# Patient Record
Sex: Female | Born: 1955
Health system: Southern US, Community
[De-identification: ages and names within clinical notes are randomized; demographics above are authoritative.]

## PROBLEM LIST (undated history)

## (undated) DIAGNOSIS — E871 Hypo-osmolality and hyponatremia: Secondary | ICD-10-CM

## (undated) DIAGNOSIS — R569 Unspecified convulsions: Secondary | ICD-10-CM

## (undated) HISTORY — DX: Hypo-osmolality and hyponatremia: E87.1

## (undated) HISTORY — DX: Unspecified convulsions: R56.9

---

## 2000-08-27 ENCOUNTER — Other Ambulatory Visit: Admission: RE | Admit: 2000-08-27 | Discharge: 2000-08-27 | Payer: Self-pay | Admitting: Obstetrics and Gynecology

## 2002-03-23 ENCOUNTER — Other Ambulatory Visit: Admission: RE | Admit: 2002-03-23 | Discharge: 2002-03-23 | Payer: Self-pay | Admitting: Obstetrics and Gynecology

## 2004-01-18 ENCOUNTER — Encounter: Admission: RE | Admit: 2004-01-18 | Discharge: 2004-01-18 | Payer: Self-pay | Admitting: Obstetrics and Gynecology

## 2004-03-08 ENCOUNTER — Encounter (INDEPENDENT_AMBULATORY_CARE_PROVIDER_SITE_OTHER): Payer: Self-pay | Admitting: *Deleted

## 2004-03-08 ENCOUNTER — Ambulatory Visit (HOSPITAL_COMMUNITY): Admission: RE | Admit: 2004-03-08 | Discharge: 2004-03-08 | Payer: Self-pay | Admitting: Obstetrics and Gynecology

## 2004-03-08 ENCOUNTER — Ambulatory Visit (HOSPITAL_BASED_OUTPATIENT_CLINIC_OR_DEPARTMENT_OTHER): Admission: RE | Admit: 2004-03-08 | Discharge: 2004-03-08 | Payer: Self-pay | Admitting: Obstetrics and Gynecology

## 2007-06-24 ENCOUNTER — Observation Stay (HOSPITAL_COMMUNITY): Admission: AD | Admit: 2007-06-24 | Discharge: 2007-06-25 | Payer: Self-pay | Admitting: Cardiology

## 2010-06-27 NOTE — H&P (Signed)
[name], [name]                   ACCOUNT NO.:  [id number]   MEDICAL RECORD NO.:  [id number]          PATIENT TYPE:  INP   LOCATION:  3739                         FACILITY:  [facility]   PHYSICIAN:  Tamera C. Lewis, N.P.  DATE OF BIRTH:  Mar 21, 1955   DATE OF ADMISSION:  06/24/2007  DATE OF DISCHARGE:                              HISTORY & PHYSICAL   HISTORY OF PRESENT ILLNESS:  Victoria Stokes is a pleasant 55 year old female  recently referred to [place] by her primary care Victoria Stokes.  She woke up this  morning with a  chest tightness in the left precordial area.  States  it felt like a poundy discomfort, but she denying any tachy palpitations  or any sensation of skipped beats.  She states she does continue to have  the tightness, which radiates to the left shoulder.  It is 3-4 on scale  of 1-10, where it was a 6 earlier this morning.  Nothing seems to make  the discomfort better or worse.  She has had some shortness of breath,  but no sweats or nausea.  The EKG in the office revealed sinus  bradycardia with no EKG changes, but due to the continued discomfort,  she is being admitted to rule out acute coronary syndrome.   REVIEW OF SYSTEMS:  GENERAL:  Has felt bad in general over the last few  days.  Denies any fever or chills.  HEENT:  No problems with vision and  hearing.  No nasal drainage, sore throat, or problems swallowing.  CHEST:  As above.  Denies any pleuritic nature to the discomfort. LUNGS:  Shortness of breath as above.  Denies PND or orthopnea.  No cough or  wheezing.  ABDOMEN:  No nausea, abdominal pain, vomiting, hematochezia,  or melena.  GU:  Denies any problems with dysuria, urinary frequency,  hematuria, or nocturia.  EXTREMITIES:  Reveals no edema.  No leg  claudication symptoms.  Arm pain from earlier has resolved.   PAST MEDICAL HISTORY:  Negative.   PAST SURGICAL HISTORY:  C-section, 10.  Injuries, head injury from a  fall, 1985, was on antiseizure medication for short  time.   CURRENT MEDICATIONS:  Aspirin 81 mg daily p.r.n.   DRUG ALLERGIES:  None known.   SOCIAL HISTORY:  She is married.  She is a Environmental education officer.  Does  not smoke.  Alcohol on rare social settings.  No excessive caffeine use.  No illicit drug use.  She exercises regularly and never has discomfort  with exercise.   FAMILY HISTORY:  Her father died at age 58 from pancreatic cancer.  Mother died at age 6 from breast cancer.  She has 4 siblings, 3 sisters  and a brother, all in fairly good condition.  There is no early coronary  artery disease history.   OBJECTIVE DATA:  Her weight is 152, pulse is 48, and blood pressure is  130/80.  GENERAL:  Very pleasant, well-nourished female in no acute distress.  SKIN:  Warm and dry.  HEENT:  Ear, nose and throat are unremarkable.  NECK:  No  JVD.  No carotid bruits are auscultated.  No adenopathy.  CHEST:  Good excursion.  Clear throughout.  HEART:  Normal S1, S2 without murmur, S3, S4.  Bradycardic at 52 beats  per minute.  ABDOMEN:  Soft and nontender.  No abdominal bruits.  Bowel sounds.  No  tenderness.  No masses or hepatosplenomegaly.  EXTREMITIES:  MAE x4.  No clubbing, cyanosis or edema.  +2 distal  pulses.   EKG reveals sinus bradycardia with no ST segment abnormalities.   IMPRESSION:  1. Chest pain, atypical.  2. Dyspnea.   PLAN:  Admit.  Obtain cardiac isoenzymes.  Nitroglycerin if needed for  chest discomfort.  Make n.p.o. after midnight for now.      Tamera C. Melvyn Neth, N.P.     TCL/MEDQ  D:  06/24/2007  T:  06/25/2007  Job:  [id number]

## 2010-06-27 NOTE — Discharge Summary (Signed)
NAMETHANA, RAMP                   ACCOUNT NO.:  [id number]   MEDICAL RECORD NO.:  [id number]          PATIENT TYPE:  INP   LOCATION:  3739                         FACILITY:  [facility]   PHYSICIAN:  Armanda Magic, M.D.     DATE OF BIRTH:  [birth year]   DATE OF ADMISSION:  06/24/2007  DATE OF DISCHARGE:  06/25/2007                               DISCHARGE SUMMARY   DISCHARGE DIAGNOSES:  1. Atypical chest pain, resolved.  2. Bradycardia in the 50s, asymptomatic.   HOSPITAL COURSE:  Victoria Stokes is a 55 year old female who awoke on the day  of admission with chest tightness in the left chest area region.  She  said that it was like a pounding discomfort, but denies any tachy  palpitations or any sensation of skipped beats.  She said at one point  the discomfort radiated to the left shoulder.  She has had some  shortness of breath but denies any sweating, nausea, or vomiting.  Nothing made the discomfort better or worse.  She continued to have the  tightness in the clinic, therefore, she was admitted to rule out  myocardial infarction.   She was ruled out by cardiac isoenzymes.  We performed a stress  Cardiolite.  She walked almost 11 minutes without any discomfort.  Her  images showed no evidence of ischemia or infarction, and she was  discharged to home.   LABORATORY DATA:  Lab studies showed, hemoglobin of 13.2, hematocrit  39.1, platelets 246, and white count 5.6.  CK-MB and troponin negative  x2.  Total cholesterol 182, LDL 102, HDL 67, triglycerides 64, TSH  2.414, and D-dimer 0.22.  Sodium 139, potassium 3.6, glucose 87, BUN 7,  and creatinine 0.89.  LFTs were normal.  Chest x-rays showed no acute  disease.   The patient does complain of some sleep difficulty and states that she  has not been sleeping well recently.  She and I have discussed this and  I have agreed to give her Ambien 10 mg 1 p.o. q.h.s. p.r.n. #15, no  refills.   She will wear event monitor, and this will be mailed  to her house.  We  wanted to make sure there is no underlying electrical abnormality.  Otherwise, she is to increase her activity slowly.  Remain on the low-  sodium, heart-healthy diet.  Follow up with Dr. Mayford Knife on July 24, 2007  at 11:15 a.m.      Guy Franco, P.A.      Armanda Magic, M.D.  Electronically Signed    LB/MEDQ  D:  06/25/2007  T:  06/26/2007  Job:  [id number]

## 2010-06-30 NOTE — Op Note (Signed)
Victoria Stokes, Victoria Stokes                   ACCOUNT NO.:  [id number]   MEDICAL RECORD NO.:  [id number]          PATIENT TYPE:  AMB   LOCATION:  [facility]                         FACILITY:  [facility]   PHYSICIAN:  Sherry A. Dickstein, M.D.DATE OF BIRTH:  May 10, 1955   DATE OF PROCEDURE:  03/08/2004  DATE OF DISCHARGE:                                 OPERATIVE REPORT   PREOPERATIVE DIAGNOSIS:  Menorrhagia, endometrial polyps.   POSTOPERATIVE DIAGNOSIS:  Menorrhagia, endometrial polyps, and submucosal  fibroids.   PROCEDURE:  D&C hysteroscopy with resectoscope.   SURGEON:  Sherry A. Rosalio Macadamia, M.D.   ANESTHESIA:  MAC.   INDICATIONS:  This is a 55 year old G2, P2-0-0-2 woman with menstrual  periods every month lasting approximately four to five days. The patient has  excessive heavy flow with her menstrual periods. Ultrasound was performed  revealing some small fibroids and a thickened endometrium. The findings were  reviewed with the patient. The patient was offered a sonohysterogram to  definitely diagnose a polyp or fibroid. The patient requested D&C directly  rather than performing the sonohysterogram first. It was felt that it was a  probable  endometrial polyp present.   FINDINGS:  Slightly enlarged anteflexed uterus approximately 10 weeks size.  No adnexal mass. Polypoid tissue in the top of the cavity and submucosal  fibroid present.   PROCEDURE:  The patient was brought to the operating room and given adequate  IV sedation. She was placed in dorsal lithotomy position. Perineum was  washed with Hibiclens. The patient was draped in a sterile fashion. Pelvic  examination was performed. Speculum was placed within the vagina. Vagina was  washed with Hibiclens. Paracervical block was administered with 1%  ____nesacaine______________. Anterior lip of the cervix was grasped with a  single-toothed tenaculum. Cervix was sounded. Cervix was dilated with  Pratt's dilators to a #1. Hysteroscope was  easily introduced into the  endometrial cavity and pictures were obtained. The polypoid tissue was  removed initially with the superficial sheets of endometrial using a double-  looped right angle resector. A posterior wall submucosal fibroid was felt to  be present during the procedure. This was removed separately. Bleeders were  cauterized. Pictures were obtained before and after the procedure. Adequate  hemostasis was present. All instruments removed from the vagina. The patient  was taken out of the dorsal lithotomy position. She was awakened. She was  moved from the operating table to a stretcher in stable condition.  Complications were none. Estimated blood loss less than 5 cc. Sorbitol  differential -80 cc.    SAD/MEDQ  D:  03/08/2004  T:  03/08/2004  Job:  [id number]

## 2011-02-20 ENCOUNTER — Other Ambulatory Visit (HOSPITAL_COMMUNITY): Payer: Self-pay | Admitting: Gastroenterology

## 2011-02-20 DIAGNOSIS — R1012 Left upper quadrant pain: Secondary | ICD-10-CM

## 2011-02-20 DIAGNOSIS — R1032 Left lower quadrant pain: Secondary | ICD-10-CM

## 2011-02-27 ENCOUNTER — Other Ambulatory Visit (HOSPITAL_COMMUNITY): Payer: Self-pay

## 2013-06-18 ENCOUNTER — Other Ambulatory Visit: Payer: Self-pay | Admitting: Family Medicine

## 2013-06-18 DIAGNOSIS — M25561 Pain in right knee: Secondary | ICD-10-CM

## 2013-07-07 ENCOUNTER — Ambulatory Visit
Admission: RE | Admit: 2013-07-07 | Discharge: 2013-07-07 | Disposition: A | Discharge status: Home / Self Care | Payer: BC Managed Care – PPO | Source: Ambulatory Visit | Attending: Family Medicine | Admitting: Family Medicine

## 2013-07-07 DIAGNOSIS — M25561 Pain in right knee: Secondary | ICD-10-CM

## 2014-02-24 ENCOUNTER — Ambulatory Visit (INDEPENDENT_AMBULATORY_CARE_PROVIDER_SITE_OTHER): Payer: BLUE CROSS/BLUE SHIELD | Admitting: Sports Medicine

## 2014-02-24 ENCOUNTER — Encounter: Payer: Self-pay | Admitting: Sports Medicine

## 2014-02-24 VITALS — BP 122/76 | HR 56 | Ht 67.0 in | Wt 145.0 lb

## 2014-02-24 DIAGNOSIS — M4120 Other idiopathic scoliosis, site unspecified: Secondary | ICD-10-CM

## 2014-02-24 DIAGNOSIS — M25562 Pain in left knee: Secondary | ICD-10-CM

## 2014-02-24 NOTE — Assessment & Plan Note (Signed)
Due to small MM tear with surrounding fluid, also seen in ST tendon with fluid. Due to underlying biomechanical factors. -Bike fit -Set-ups, crossover, and lateral step-ups 10 x 3 sets each -Body helix compression sleeve fitted and applied to right knee -Aleve 1-2 tablets around dinner time to see if helps nighttime symptoms. -Other things like Tumeric or Arnica gel may be beneficial -Follow-up in about 8 weeks or sooner if needed

## 2014-02-24 NOTE — Progress Notes (Signed)
   Subjective:    Patient ID: Victoria Stokes, female    DOB: [birth year], 59 y.o.   MRN: [id number]  HPI  Mrs. Muhlbauer is a 59 year old new patient who presents with right knee pain. Onset of symptoms was approximately one year ago without any known acute injury. She was previously seen at [facility] where x-rays and an MRI were performed of the right knee. This is available for review today. She attributes that perhaps her increase in cycling and hiking lead to her symptoms. She is also former [employer] fast streamer, right arm dominant, and has dealt with dextroscoliosis much of her younger years. Symptoms are aggravated with activities such as standing from a seated position. She has tried some strengthening of her abductors and sparing use of anti-inflammatories, but she does not like taking much medication. She has not tried any compression. She is looking forward to a 5 day cycling trip as well as a hiking trip in April of this year. She denies significant mechanical locking, popping, or swelling.  Past medical history, social history, medications, and allergies were reviewed and are up to date in the chart.  Review of Systems 7 point review of systems was performed and was otherwise negative unless noted in the history of present illness.     Objective:   Physical Exam BP 122/76 mmHg  Pulse 56  Ht 5\' 7"  (1.702 m)  Wt 145 lb (65.772 kg)  BMI 22.71 kg/m2 GEN: The patient is well-developed well-nourished female and in no acute distress.  She is awake alert and oriented x3. SKIN: warm and well-perfused, no rash  EXTR: No lower extremity edema or calf tenderness Neuro: Strength 5/5 globally. Sensation intact throughout. No focal deficits. Vasc: +2 bilateral distal pulses. No edema.  MSK: Standing structural exam reveals elevation of the right iliac crest. Gait analysis reveals that she drops the right shoulder, right hip, in places additional pressure on the lateral portion  of the right knee. No leg length discrepancy is noted. She has good hip abductor strength. Good VMO strength. Her medial abductors are slightly weak. Examination of the right knee reveals no effusion. Range of motion is from 0-120 with mild pain medially. Negative patellar grind test. No laxity with valgus or varus stress testing. No overlying erythema or induration.  MRI 07/07/13: IMPRESSION: 1. Complex tear of the posterior horn- body junction of the medial meniscus extending to the inferior and superior articular surfaces.  Limited musculoskeletal ultrasound: Long and short axis views were obtained of the right knee which show a minimal increase in hypoechoic fluid in the suprapatellar pouch. A small tear is seen in the mid medial meniscus without significant fluid edema. Going posteriorly, she has an increase in hypoechoic fluid, though she has more fluid seen around the medial semi-tendinosis tendon posteriorly. The remainder of the knee exam was unremarkable.    Assessment & Plan:  Please see problem based assessment and plan in the problem list.

## 2014-02-24 NOTE — Assessment & Plan Note (Signed)
Causing right rotation of thoracolumbar spine and right hip. Drops right shoulder with gait. Increased R knee pressure. -Added right lateral heel wedge to try to unload right knee -Stretching, rotation exercise to try to correct curve

## 2014-02-24 NOTE — Patient Instructions (Signed)
-  Bike fit, no more than 80 deg knee bend, about an inch short of full knee extension -Set-ups, crossover step-ups, and lateral step-ups 10 x 3 sets each at least 3-4x/wk. Limit if more than mild pain with this. -Body helix compression sleeve fitted and applied to right knee, wear when active but not if resting or sleeping -Aleve 1-2 tablets around dinner time to see if helps nighttime symptoms. -Other things like Tumeric or Arnica gel may be beneficial -Gentle rotational exercises and stretching to bend to right may help correct the curve and prevent muscle tightness. -Follow-up in about 8 weeks or sooner if needed

## 2014-04-01 ENCOUNTER — Ambulatory Visit: Payer: BLUE CROSS/BLUE SHIELD | Admitting: Sports Medicine

## 2014-04-14 ENCOUNTER — Ambulatory Visit (INDEPENDENT_AMBULATORY_CARE_PROVIDER_SITE_OTHER): Payer: BLUE CROSS/BLUE SHIELD | Admitting: Sports Medicine

## 2014-04-14 ENCOUNTER — Encounter: Payer: Self-pay | Admitting: Sports Medicine

## 2014-04-14 VITALS — BP 91/63 | Ht 67.0 in | Wt 145.0 lb

## 2014-04-14 DIAGNOSIS — M25561 Pain in right knee: Secondary | ICD-10-CM

## 2014-04-14 NOTE — Assessment & Plan Note (Signed)
Ultrasound resolution of previously seen small right knee effusion and meniscal changes -Advance activities to include straight leg raises and 20 maximum knee bend exercises -Once her knee pain resolves, she can start to try the upright bicycle -Continue the body was compression sleeve -Anti-inflammatories if needed -She will followup after her bicycle trip or sooner if needed

## 2014-04-14 NOTE — Progress Notes (Signed)
   Subjective:    Patient ID: Victoria Stokes, female    DOB: 1955-08-22, 59 y.o.   MRN: [id number]  HPI Victoria Stokes Is a 59 year old female who presents for followup of right knee pain.  Her symptoms have improved since we last saw her.  She is been wearing the body helix compression sleeve to the right knee.  She denies any swelling or mechanical locking.  Location of pain is across the medial joint line.  Recall, that there was a possible complex tear involving the posterior horn of the medial meniscus seen on MRI.  She has been doing the recumbent bike and some gentle exercises.  She is trying to prepare for a five-day bike trip to [place] in the middle of April.  She denies any feeling of knee instability.  Past medical history, social history, medications, and allergies were reviewed and are up to date in the chart.  Review of Systems 7 point review of systems was performed and was otherwise negative unless noted in the history of present illness.     Objective:   Physical Exam BP 91/63 mmHg  Ht 5\' 7"  (1.702 m)  Wt 145 lb (65.772 kg)  BMI 22.71 kg/m2 GEN: The patient is well-developed well-nourished female and in no acute distress.  She is awake alert and oriented x3. SKIN: warm and well-perfused, no rash  EXTR: No lower extremity edema or calf tenderness Neuro: Strength 5/5 globally. Sensation intact throughout. No focal deficits. Vasc: +2 bilateral distal pulses. No edema.  MSK: Examination of the right knee reveals no effusion.  No erythema or induration.  No medial joint line tenderness.  Negative patellar grind test.  No instability with valgus or varus testing. No atrophy.  Limited musculoskeletal ultrasound: he is of the right knee were obtained.  Previously demonstrated small knee effusion has resolved.  The medial meniscus appears to have normalized and there is no longer surrounding fluid edema.  The previously seen semi-tendinosis fluid has completely resolved.      Assessment & Plan:  Please see problem based assessment and plan in the problem list.

## 2014-06-10 ENCOUNTER — Ambulatory Visit: Payer: BLUE CROSS/BLUE SHIELD | Admitting: Sports Medicine

## 2015-06-28 DIAGNOSIS — D2371 Other benign neoplasm of skin of right lower limb, including hip: Secondary | ICD-10-CM | POA: Diagnosis not present

## 2015-07-26 DIAGNOSIS — Z0000 Encounter for general adult medical examination without abnormal findings: Secondary | ICD-10-CM | POA: Diagnosis not present

## 2015-09-28 DIAGNOSIS — H00021 Hordeolum internum right upper eyelid: Secondary | ICD-10-CM | POA: Diagnosis not present

## 2015-10-26 DIAGNOSIS — H3530 Unspecified macular degeneration: Secondary | ICD-10-CM | POA: Diagnosis not present

## 2015-11-11 DIAGNOSIS — Z23 Encounter for immunization: Secondary | ICD-10-CM | POA: Diagnosis not present

## 2015-11-11 DIAGNOSIS — G479 Sleep disorder, unspecified: Secondary | ICD-10-CM | POA: Diagnosis not present

## 2015-11-15 DIAGNOSIS — D2371 Other benign neoplasm of skin of right lower limb, including hip: Secondary | ICD-10-CM | POA: Diagnosis not present

## 2015-11-15 DIAGNOSIS — D2372 Other benign neoplasm of skin of left lower limb, including hip: Secondary | ICD-10-CM | POA: Diagnosis not present

## 2015-11-17 DIAGNOSIS — L821 Other seborrheic keratosis: Secondary | ICD-10-CM | POA: Diagnosis not present

## 2016-02-10 DIAGNOSIS — Z803 Family history of malignant neoplasm of breast: Secondary | ICD-10-CM | POA: Diagnosis not present

## 2016-02-10 DIAGNOSIS — Z1231 Encounter for screening mammogram for malignant neoplasm of breast: Secondary | ICD-10-CM | POA: Diagnosis not present

## 2016-02-21 DIAGNOSIS — D2371 Other benign neoplasm of skin of right lower limb, including hip: Secondary | ICD-10-CM | POA: Diagnosis not present

## 2016-02-21 DIAGNOSIS — M21961 Unspecified acquired deformity of right lower leg: Secondary | ICD-10-CM | POA: Diagnosis not present

## 2016-04-02 DIAGNOSIS — Z6821 Body mass index (BMI) 21.0-21.9, adult: Secondary | ICD-10-CM | POA: Diagnosis not present

## 2016-04-02 DIAGNOSIS — Z01419 Encounter for gynecological examination (general) (routine) without abnormal findings: Secondary | ICD-10-CM | POA: Diagnosis not present

## 2016-07-17 DIAGNOSIS — M859 Disorder of bone density and structure, unspecified: Secondary | ICD-10-CM | POA: Diagnosis not present

## 2016-07-17 DIAGNOSIS — G479 Sleep disorder, unspecified: Secondary | ICD-10-CM | POA: Diagnosis not present

## 2016-07-17 DIAGNOSIS — Z13220 Encounter for screening for lipoid disorders: Secondary | ICD-10-CM | POA: Diagnosis not present

## 2016-07-17 DIAGNOSIS — Z0000 Encounter for general adult medical examination without abnormal findings: Secondary | ICD-10-CM | POA: Diagnosis not present

## 2016-07-30 DIAGNOSIS — M546 Pain in thoracic spine: Secondary | ICD-10-CM | POA: Diagnosis not present

## 2016-07-30 DIAGNOSIS — R112 Nausea with vomiting, unspecified: Secondary | ICD-10-CM | POA: Diagnosis not present

## 2016-08-01 DIAGNOSIS — E871 Hypo-osmolality and hyponatremia: Secondary | ICD-10-CM | POA: Diagnosis not present

## 2016-08-02 ENCOUNTER — Other Ambulatory Visit: Payer: Self-pay | Admitting: Family Medicine

## 2016-08-02 ENCOUNTER — Ambulatory Visit
Admission: RE | Admit: 2016-08-02 | Discharge: 2016-08-02 | Disposition: A | Discharge status: Home / Self Care | Payer: BLUE CROSS/BLUE SHIELD | Source: Ambulatory Visit | Attending: Family Medicine | Admitting: Family Medicine

## 2016-08-02 DIAGNOSIS — E871 Hypo-osmolality and hyponatremia: Secondary | ICD-10-CM

## 2016-08-02 DIAGNOSIS — G40909 Epilepsy, unspecified, not intractable, without status epilepticus: Secondary | ICD-10-CM | POA: Diagnosis not present

## 2016-08-03 DIAGNOSIS — S22040A Wedge compression fracture of fourth thoracic vertebra, initial encounter for closed fracture: Secondary | ICD-10-CM | POA: Diagnosis not present

## 2016-08-06 DIAGNOSIS — R569 Unspecified convulsions: Secondary | ICD-10-CM | POA: Diagnosis not present

## 2016-08-06 DIAGNOSIS — M4854XD Collapsed vertebra, not elsewhere classified, thoracic region, subsequent encounter for fracture with routine healing: Secondary | ICD-10-CM | POA: Diagnosis not present

## 2016-08-09 DIAGNOSIS — D72819 Decreased white blood cell count, unspecified: Secondary | ICD-10-CM | POA: Diagnosis not present

## 2016-08-09 DIAGNOSIS — R899 Unspecified abnormal finding in specimens from other organs, systems and tissues: Secondary | ICD-10-CM | POA: Diagnosis not present

## 2016-08-27 DIAGNOSIS — S22040D Wedge compression fracture of fourth thoracic vertebra, subsequent encounter for fracture with routine healing: Secondary | ICD-10-CM | POA: Diagnosis not present

## 2016-09-03 DIAGNOSIS — M810 Age-related osteoporosis without current pathological fracture: Secondary | ICD-10-CM | POA: Diagnosis not present

## 2016-09-03 DIAGNOSIS — M8588 Other specified disorders of bone density and structure, other site: Secondary | ICD-10-CM | POA: Diagnosis not present

## 2016-09-14 DIAGNOSIS — E871 Hypo-osmolality and hyponatremia: Secondary | ICD-10-CM | POA: Diagnosis not present

## 2016-09-20 DIAGNOSIS — M810 Age-related osteoporosis without current pathological fracture: Secondary | ICD-10-CM | POA: Diagnosis not present

## 2016-09-20 DIAGNOSIS — M4854XD Collapsed vertebra, not elsewhere classified, thoracic region, subsequent encounter for fracture with routine healing: Secondary | ICD-10-CM | POA: Diagnosis not present

## 2016-09-20 DIAGNOSIS — E871 Hypo-osmolality and hyponatremia: Secondary | ICD-10-CM | POA: Diagnosis not present

## 2016-10-29 ENCOUNTER — Encounter: Payer: Self-pay | Admitting: Neurology

## 2016-10-29 ENCOUNTER — Ambulatory Visit (INDEPENDENT_AMBULATORY_CARE_PROVIDER_SITE_OTHER): Payer: BLUE CROSS/BLUE SHIELD | Admitting: Neurology

## 2016-10-29 ENCOUNTER — Encounter (INDEPENDENT_AMBULATORY_CARE_PROVIDER_SITE_OTHER): Payer: Self-pay

## 2016-10-29 DIAGNOSIS — R569 Unspecified convulsions: Secondary | ICD-10-CM | POA: Insufficient documentation

## 2016-10-29 HISTORY — DX: Unspecified convulsions: R56.9

## 2016-10-29 MED ORDER — ALPRAZOLAM 0.5 MG PO TABS: ORAL_TABLET | ORAL | 0 refills | Status: DC

## 2016-10-29 NOTE — Patient Instructions (Signed)
   We will check MRI of the brain and get an EEG study.

## 2016-10-29 NOTE — Progress Notes (Signed)
Reason for visit: Seizures  Referring physician: Dr. Stark Jock is a 61 y.o. female  History of present illness:  Victoria Stokes is a 61 year old right-handed white female with a history of seizures. The patient indicates that her first seizure occurred about an hour following a concussion when she fell on ice while ice skating. The patient had a generalized tonic clonic seizure at that time that was witnessed, the seizure occurred at the hospital. This was in 1985. The patient had a second seizure in 1987 when she had just finished running a triathlon. This was felt secondary to electrolyte disturbances. The patient had a third seizure in June 2018. The patient had been working in the garden on a hot day, the seizure event was unwitnessed and occurred at nighttime. The patient noted onset of back pain associated with a T4 compression fracture and she had bitten her tongue, she had nausea and vomiting after the seizure event. The patient was noted to have a sodium level of 126. The patient underwent a CT scan of the brain that was unremarkable. She is sent to this office for an evaluation. She has not had any further seizure-type events. She reports no numbness or weakness of the face, arms, or legs. She denies issues controlling the bowels or the bladder or any difficulty with balance. She has been drinking salted water to get her sodium level up, she claims that her recent blood evaluation revealed that the sodium level had returned to normal. She has in the past chronically run low sodium levels in the low 130 range. The patient denies a family history of seizures. She is sent to this office for an evaluation.  Past Medical History:  Diagnosis Date  . Low sodium levels    Hx    Past Surgical History:  Procedure Laterality Date  . CESAREAN SECTION      Family History  Problem Relation Age of Onset  . Breast cancer Mother   . Pancreatic cancer Father     Social history:  reports  that she has never smoked. She has never used smokeless tobacco. She reports that she drinks alcohol. She reports that she does not use drugs.  Medications:  Prior to Admission medications   Medication Sig Start Date End Date Taking? Authorizing Provider  B Complex Vitamins (VITAMIN B-COMPLEX PO) Take by mouth.   Yes [provider]  CALCIUM PO Take by mouth.   Yes [provider]  Cholecalciferol (VITAMIN D PO) Take by mouth.   Yes [provider]  CHOLINE PO Take by mouth.   Yes [provider]  VITAMIN A PO Take by mouth.   Yes [provider]  VITAMIN K PO Take by mouth.   Yes [provider]  zolpidem (AMBIEN) 5 MG tablet Take 5 mg by mouth as needed for sleep.   Yes [provider]     No Known Allergies  ROS:  Out of a complete 14 system review of symptoms, the patient complains only of the following symptoms, and all other reviewed systems are negative.  Seizures  Blood pressure (!) 91/59, pulse (!) 59, height 5\' 7"  (1.702 m), weight 148 lb (67.1 kg).  Physical Exam  General: The patient is alert and cooperative at the time of the examination.  Eyes: Pupils are equal, round, and reactive to light. Discs are flat bilaterally.  Neck: The neck is supple, no carotid bruits are noted.  Respiratory: The respiratory examination  is clear.  Cardiovascular: The cardiovascular examination reveals a regular rate and rhythm, no obvious murmurs or rubs are noted.  Skin: Extremities are without significant edema.  Neurologic Exam  Mental status: The patient is alert and oriented x 3 at the time of the examination. The patient has apparent normal recent and remote memory, with an apparently normal attention span and concentration ability.  Cranial nerves: Facial symmetry is present. There is good sensation of the face to pinprick and soft touch bilaterally. The strength of the facial muscles and the muscles to head  turning and shoulder shrug are normal bilaterally. Speech is well enunciated, no aphasia or dysarthria is noted. Extraocular movements are full. Visual fields are full. The tongue is midline, and the patient has symmetric elevation of the soft palate. No obvious hearing deficits are noted.  Motor: The motor testing reveals 5 over 5 strength of all 4 extremities. Good symmetric motor tone is noted throughout.  Sensory: Sensory testing is intact to pinprick, soft touch, vibration sensation, and position sense on all 4 extremities. No evidence of extinction is noted.  Coordination: Cerebellar testing reveals good finger-nose-finger and heel-to-shin bilaterally.  Gait and station: Gait is normal. Tandem gait is normal. Romberg is negative. No drift is seen.  Reflexes: Deep tendon reflexes are symmetric and normal bilaterally. Toes are downgoing bilaterally.   CT head 08/02/16:  IMPRESSION: No acute intracranial abnormalities.  * CT scan images were reviewed online. I agree with the written report.    Assessment/Plan:  1. History of seizures  2. Chronic hyponatremia  The patient has had 3 seizures lifetime, the first associated with a concussion, and 2 other seizures associated with electrolyte disturbances. The seizures may be symptomatic seizures, but given the history of a concussion, the patient may be at risk for epilepsy. She will undergo MRI of the brain with and without gadolinium enhancement, she will have an EEG study. She will follow-up in 6 months, I have asked her not to operate a motor vehicle for 6 months following the date of the last seizure. We will not initiate anticonvulsant therapy at this time.   Jill Alexanders MD 10/29/2016 8:25 AM  [facility] [address] [place], [place] [address]  Phone [phone] Fax [phone]

## 2016-11-05 ENCOUNTER — Telehealth: Payer: Self-pay | Admitting: Neurology

## 2016-11-05 ENCOUNTER — Ambulatory Visit (INDEPENDENT_AMBULATORY_CARE_PROVIDER_SITE_OTHER): Payer: BLUE CROSS/BLUE SHIELD | Admitting: Neurology

## 2016-11-05 DIAGNOSIS — G40909 Epilepsy, unspecified, not intractable, without status epilepticus: Secondary | ICD-10-CM

## 2016-11-05 DIAGNOSIS — E871 Hypo-osmolality and hyponatremia: Secondary | ICD-10-CM | POA: Diagnosis not present

## 2016-11-05 DIAGNOSIS — R569 Unspecified convulsions: Secondary | ICD-10-CM

## 2016-11-05 DIAGNOSIS — R5383 Other fatigue: Secondary | ICD-10-CM | POA: Diagnosis not present

## 2016-11-05 DIAGNOSIS — Z8349 Family history of other endocrine, nutritional and metabolic diseases: Secondary | ICD-10-CM | POA: Diagnosis not present

## 2016-11-05 DIAGNOSIS — Z87898 Personal history of other specified conditions: Secondary | ICD-10-CM | POA: Diagnosis not present

## 2016-11-05 MED ORDER — LEVETIRACETAM 500 MG PO TABS: 500.0000 mg | ORAL_TABLET | Freq: Two times a day (BID) | ORAL | 3 refills | Status: DC

## 2016-11-05 NOTE — Telephone Encounter (Signed)
I called patient. The EEG study. Showed bursts of theta higher amplitude activity that may occur in the right hemisphere and then the left hemisphere, occasionally together. The study suggests a lowered seizure threshold, I will call in a prescription for Keppra and get that started.

## 2016-11-05 NOTE — Procedures (Signed)
     History: Victoria Stokes is a 61 year old patient with a history of several seizures in her lifetime, the first event in 1985 following a concussion, she had 2 other seizures associated with extreme heat or exercise. The patient is being evaluated for these seizures. Last such seizure was in June 2018. The patient was noted to have a sodium level of 126 around the time of the seizure.  This is a routine EEG. No skull defects are noted. Medications Ambien, calcium supplementation, and multivitamins.  EEG classification: Dysrhythmia grade 2 bitemporal  Description of the recording: The background rhythms of this recording consists of a well modulated medium amplitude alpha rhythm of 11 Hz that is reactive to eye opening and closure. As the record progresses, photic stimulation is performed and resulted in excellent bilaterally symmetric photic driving response. Hyperventilation is then performed resulting in a slight buildup of the background rhythm activities with slight generalized slowing seen. Following hyperventilation, the patient appears to have bursts of 5 or 6 Hz theta slowing of higher amplitude that emanate from the right greater than left temporal area in an independent fashion, occasionally in conjunction with one another. No spike or spike-wave discharges were seen. EKG monitor shows no evidence of cardiac rhythm and abnormalities with a heart rate of 60. At times during the recording, the patient appears to enter the drowsy state.  Impression: This is an abnormal EEG recording secondary to right and left temporal bursts of higher amplitude theta activity lasting approximately 1 second. The episodes may occur independent of one another but are more prominent on the right temporal area. This recording suggests a lowered seizure threshold, no electrographic seizures were seen.

## 2016-11-12 ENCOUNTER — Telehealth: Payer: Self-pay | Admitting: Neurology

## 2016-11-12 DIAGNOSIS — I7300 Raynaud's syndrome without gangrene: Secondary | ICD-10-CM | POA: Diagnosis not present

## 2016-11-12 DIAGNOSIS — L659 Nonscarring hair loss, unspecified: Secondary | ICD-10-CM | POA: Diagnosis not present

## 2016-11-12 DIAGNOSIS — E871 Hypo-osmolality and hyponatremia: Secondary | ICD-10-CM | POA: Diagnosis not present

## 2016-11-12 DIAGNOSIS — R5383 Other fatigue: Secondary | ICD-10-CM | POA: Diagnosis not present

## 2016-11-12 DIAGNOSIS — R799 Abnormal finding of blood chemistry, unspecified: Secondary | ICD-10-CM | POA: Diagnosis not present

## 2016-11-12 DIAGNOSIS — Z8349 Family history of other endocrine, nutritional and metabolic diseases: Secondary | ICD-10-CM | POA: Diagnosis not present

## 2016-11-12 DIAGNOSIS — M4850XA Collapsed vertebra, not elsewhere classified, site unspecified, initial encounter for fracture: Secondary | ICD-10-CM | POA: Diagnosis not present

## 2016-11-12 MED ORDER — ALPRAZOLAM 0.5 MG PO TABS: ORAL_TABLET | ORAL | 0 refills | Status: DC

## 2016-11-12 NOTE — Telephone Encounter (Signed)
Will re-print the alprazolam prescription.

## 2016-11-12 NOTE — Telephone Encounter (Signed)
Spoke with pt. who sts. she lost Xanax rx. (for MRI meds).  Never had it filled.  Will check with Dr. Jannifer Franklin for approval to reprint./fim

## 2016-11-12 NOTE — Telephone Encounter (Signed)
Pt states that by accident she has misplaced the instructions re: the MRI and the prescription that was written for her, please call

## 2016-11-12 NOTE — Addendum Note (Signed)
Addended by: Kathrynn Ducking on: 11/12/2016 04:41 PM   Modules accepted: Orders

## 2016-11-12 NOTE — Telephone Encounter (Signed)
I spoke to the patient and informed her there is not restriction's on having the MRI. She can eat normal and take her medication like usual just don't wear anything with medal so no jewelry.

## 2016-11-13 NOTE — Telephone Encounter (Signed)
Events noted

## 2016-11-13 NOTE — Telephone Encounter (Signed)
Patient canceled her MRI due to she would like to get a second opinion. She states she will call back later to reschedule.

## 2016-11-13 NOTE — Telephone Encounter (Signed)
I have faxed signed refill for Alprazolam 0.5mg  to [facility].

## 2016-11-14 ENCOUNTER — Other Ambulatory Visit: Payer: BLUE CROSS/BLUE SHIELD

## 2016-11-16 ENCOUNTER — Telehealth: Payer: Self-pay | Admitting: Hematology and Oncology

## 2016-11-16 ENCOUNTER — Encounter: Payer: Self-pay | Admitting: Hematology and Oncology

## 2016-11-16 DIAGNOSIS — H3530 Unspecified macular degeneration: Secondary | ICD-10-CM | POA: Diagnosis not present

## 2016-11-16 NOTE — Telephone Encounter (Signed)
Appt has been scheduled for the pt to see Dr. Lebron Conners on 10/9 at 1pm. The referring office will notify the pt. Letter mailed.

## 2016-11-20 ENCOUNTER — Encounter: Payer: Self-pay | Admitting: Hematology and Oncology

## 2016-11-20 ENCOUNTER — Ambulatory Visit (HOSPITAL_BASED_OUTPATIENT_CLINIC_OR_DEPARTMENT_OTHER): Payer: BLUE CROSS/BLUE SHIELD | Admitting: Hematology and Oncology

## 2016-11-20 ENCOUNTER — Telehealth: Payer: Self-pay | Admitting: Hematology and Oncology

## 2016-11-20 ENCOUNTER — Ambulatory Visit (HOSPITAL_BASED_OUTPATIENT_CLINIC_OR_DEPARTMENT_OTHER): Payer: BLUE CROSS/BLUE SHIELD

## 2016-11-20 VITALS — BP 121/70 | HR 70 | Temp 98.6°F | Resp 18 | Ht 67.0 in | Wt 145.8 lb

## 2016-11-20 DIAGNOSIS — D472 Monoclonal gammopathy: Secondary | ICD-10-CM

## 2016-11-20 DIAGNOSIS — M4854XA Collapsed vertebra, not elsewhere classified, thoracic region, initial encounter for fracture: Secondary | ICD-10-CM

## 2016-11-20 LAB — COMPREHENSIVE METABOLIC PANEL
ALK PHOS: 78 U/L (ref 40–150)
ALT: 25 U/L (ref 0–55)
ANION GAP: 9 meq/L (ref 3–11)
AST: 23 U/L (ref 5–34)
Albumin: 4.6 g/dL (ref 3.5–5.0)
BUN: 15.3 mg/dL (ref 7.0–26.0)
CO2: 27 mEq/L (ref 22–29)
Calcium: 9.9 mg/dL (ref 8.4–10.4)
Chloride: 99 mEq/L (ref 98–109)
Creatinine: 0.9 mg/dL (ref 0.6–1.1)
EGFR: 71 mL/min/{1.73_m2} — AB (ref >90)
GLUCOSE: 97 mg/dL (ref 70–140)
Potassium: 3.9 mEq/L (ref 3.5–5.1)
Sodium: 134 mEq/L — ABNORMAL LOW (ref 136–145)
Total Bilirubin: 0.57 mg/dL (ref 0.20–1.20)
Total Protein: 8.5 g/dL — ABNORMAL HIGH (ref 6.4–8.3)

## 2016-11-20 LAB — CBC WITH DIFFERENTIAL/PLATELET
BASO%: 0.6 % (ref 0.0–2.0)
Basophils Absolute: 0 10*3/uL (ref 0.0–0.1)
EOS%: 1 % (ref 0.0–7.0)
Eosinophils Absolute: 0.1 10*3/uL (ref 0.0–0.5)
HCT: 41.1 % (ref 34.8–46.6)
HGB: 13.7 g/dL (ref 11.6–15.9)
LYMPH%: 23.3 % (ref 14.0–49.7)
MCH: 32.5 pg (ref 25.1–34.0)
MCHC: 33.4 g/dL (ref 31.5–36.0)
MCV: 97.4 fL (ref 79.5–101.0)
MONO#: 0.4 10*3/uL (ref 0.1–0.9)
MONO%: 7 % (ref 0.0–14.0)
NEUT#: 3.4 10*3/uL (ref 1.5–6.5)
NEUT%: 68.1 % (ref 38.4–76.8)
Platelets: 284 10*3/uL (ref 145–400)
RBC: 4.22 10*6/uL (ref 3.70–5.45)
RDW: 13.1 % (ref 11.2–14.5)
WBC: 5 10*3/uL (ref 3.9–10.3)
lymph#: 1.2 10*3/uL (ref 0.9–3.3)

## 2016-11-20 LAB — LACTATE DEHYDROGENASE: LDH: 171 U/L (ref 125–245)

## 2016-11-20 LAB — URIC ACID: Uric Acid, Serum: 4.4 mg/dl (ref 2.6–7.4)

## 2016-11-20 NOTE — Telephone Encounter (Signed)
Gave avs and calendar for October  °

## 2016-11-21 LAB — KAPPA/LAMBDA LIGHT CHAINS
Ig Kappa Free Light Chain: 15.9 mg/L (ref 3.3–19.4)
Ig Lambda Free Light Chain: 11.9 mg/L (ref 5.7–26.3)
Kappa/Lambda FluidC Ratio: 1.34 (ref 0.26–1.65)

## 2016-11-21 LAB — BETA 2 MICROGLOBULIN, SERUM: Beta-2: 1.7 mg/L (ref 0.6–2.4)

## 2016-11-23 DIAGNOSIS — D472 Monoclonal gammopathy: Secondary | ICD-10-CM | POA: Diagnosis not present

## 2016-11-26 ENCOUNTER — Ambulatory Visit (HOSPITAL_COMMUNITY)
Admission: RE | Admit: 2016-11-26 | Discharge: 2016-11-26 | Disposition: A | Discharge status: Home / Self Care | Payer: BLUE CROSS/BLUE SHIELD | Source: Ambulatory Visit | Attending: Hematology and Oncology | Admitting: Hematology and Oncology

## 2016-11-26 DIAGNOSIS — M899 Disorder of bone, unspecified: Secondary | ICD-10-CM | POA: Diagnosis not present

## 2016-11-26 DIAGNOSIS — D472 Monoclonal gammopathy: Secondary | ICD-10-CM | POA: Insufficient documentation

## 2016-11-26 DIAGNOSIS — Z8349 Family history of other endocrine, nutritional and metabolic diseases: Secondary | ICD-10-CM | POA: Diagnosis not present

## 2016-11-26 DIAGNOSIS — E871 Hypo-osmolality and hyponatremia: Secondary | ICD-10-CM | POA: Diagnosis not present

## 2016-11-26 DIAGNOSIS — M810 Age-related osteoporosis without current pathological fracture: Secondary | ICD-10-CM | POA: Diagnosis not present

## 2016-11-26 DIAGNOSIS — Z87898 Personal history of other specified conditions: Secondary | ICD-10-CM | POA: Diagnosis not present

## 2016-11-26 DIAGNOSIS — I7300 Raynaud's syndrome without gangrene: Secondary | ICD-10-CM | POA: Diagnosis not present

## 2016-11-26 LAB — MULTIPLE MYELOMA PANEL, SERUM
Albumin SerPl Elph-Mcnc: 4.1 g/dL (ref 2.9–4.4)
Albumin/Glob SerPl: 1.1 (ref 0.7–1.7)
Alpha 1: 0.3 g/dL (ref 0.0–0.4)
Alpha2 Glob SerPl Elph-Mcnc: 0.8 g/dL (ref 0.4–1.0)
B-Globulin SerPl Elph-Mcnc: 1.1 g/dL (ref 0.7–1.3)
Gamma Glob SerPl Elph-Mcnc: 1.7 g/dL (ref 0.4–1.8)
Globulin, Total: 3.8 g/dL (ref 2.2–3.9)
IgA, Qn, Serum: 203 mg/dL (ref 87–352)
IgG, Qn, Serum: 1840 mg/dL — ABNORMAL HIGH (ref 700–1600)
IgM, Qn, Serum: 86 mg/dL (ref 26–217)
M Protein SerPl Elph-Mcnc: 1.1 g/dL — ABNORMAL HIGH
Total Protein: 7.9 g/dL (ref 6.0–8.5)

## 2016-11-26 NOTE — Progress Notes (Signed)
[facility] New Visit:  Assessment: MGUS (monoclonal gammopathy of unknown significance) 61 year old female with new discovery of monoclonal gammopathy of unknown significance in the context of compression fracture of T4 vertebral body.  Patient's hematological profile is interesting due to presence off and neutropenia with mild severity and absence of anemia or thrombocytopenia. Coupled with monoclonal gammopathy, differential includes primary hematological malignancy such as multiple myeloma or lymphoplasmacytic lymphoma versus other autoimmune or infectious processes. ANA and rheumatoid factor were negative recently, making primary hematological condition more likely comparatively.  Plan: --Labs as outlined below to eval for Multiple myeloma --Skeletal survey --Return to clinic in 1 week to review the findings.  Voice recognition software was used and creation of this note. Despite my best effort at editing the text, some misspelling/errors may have occurred.  Orders Placed This Encounter  Procedures  . DG Bone Survey Met    Standing Status:   Future    Number of Occurrences:   1    Standing Expiration Date:   01/20/2018    Order Specific Question:   Reason for Exam (SYMPTOM  OR DIAGNOSIS REQUIRED)    Answer:   MGUS, please eval for presence of lytic lesions    Order Specific Question:   Is patient pregnant?    Answer:   No    Order Specific Question:   Preferred imaging location?    Answer:   [facility]    Order Specific Question:   Radiology Contrast Protocol - do NOT remove file path    Answer:   \\[email or web address].[email or web address]  . CBC with Differential    Standing Status:   Future    Number of Occurrences:   1    Standing Expiration Date:   11/20/2017  . Comprehensive metabolic panel    Standing Status:   Future    Number of Occurrences:   1    Standing Expiration Date:   11/20/2017  . Lactate dehydrogenase (LDH)     Standing Status:   Future    Number of Occurrences:   1    Standing Expiration Date:   11/20/2017  . Uric acid    Standing Status:   Future    Number of Occurrences:   1    Standing Expiration Date:   11/20/2017  . Beta 2 microglobulin, serum    Standing Status:   Future    Number of Occurrences:   1    Standing Expiration Date:   11/20/2017  . Multiple Myeloma Panel (SPEP&IFE w/QIG)    Standing Status:   Future    Number of Occurrences:   1    Standing Expiration Date:   11/20/2017  . Kappa/lambda light chains    Standing Status:   Future    Number of Occurrences:   1    Standing Expiration Date:   11/20/2017  . 24-Hr Ur UPEP/UIFE/Light Chains/TP    Standing Status:   Future    Number of Occurrences:   1    Standing Expiration Date:   11/20/2017    All questions were answered.  . The patient knows to call the clinic with any problems, questions or concerns.  This note was electronically signed.    History of Presenting Illness REVECCA Stokes 61 y.o. presenting to the [facility] for evaluation of monoclonal gammopathy. Patient has history of infrequent seizure events with the last one occurring on Father's Day in 2018 following a period of outdoors activity with dehydration. Previous events  occurred in [place] and similar circumstances and were all associated with significant hyponatremia. In the interim, patient has been seizure-free without any antiseizure medications. Following the latest seizure event, patient has developed significant discomfort in the mid back. The area had discomfort in the past, but severity has increased. Imaging was obtained demonstrating a compression fracture at T4 with unknown acuity. At this time, pain and discomfort have resolved. Patient denies weakness or paresthesias in the lower extremities. Denies any paresthesia or her bladder or bowel. Patient also indicates Raynaud's phenomenon in bilateral hands, some thinning of her hair, but denies any oral  sores. She does have chronic sinus pressure with sinus drainage and sore throat. No significant febrile illness, and no epistaxis or purulent nasal drainage.  Due to a discovery on a compression fracture, additional lab work was obtained demonstrating presence of monoclonal gammopathy leading to referral to our service. Please see the lab work below for details.  Oncological/hematological History: --Labs, 01/22/12:                                                                                                            WBC 3.0, ANC 1.3, ALC 1.2, Mono 0.3, Eos 0.2, Baso 0.0,  Hgb 12.8, Plt 228; --Labs, 08/01/16: tProt   ..., Alb   ..., Ca 9.7, Cr 0.8, AP  .Marland Kitchen.;                                           WBC 5.0, Nl diff,                                                                     Hgb 13.6, Plt 307 --Labs, 11/12/16: tProt 7.3, Alb 4.4, Ca 9.8, Cr 0.8, AP 61; SPEP -- M-Spike 1.1g/dL; WBC 3.3, ANC 1.6, ALC 1.3, Mono 0.3, Eos 0.1, Baso 0.0, Hgb 14.3, Plt 248; ANA & RF negative   Medical History: Past Medical History:  Diagnosis Date  . Low sodium levels    Hx  . Seizures ([place]) 10/29/2016    Surgical History: Past Surgical History:  Procedure Laterality Date  . CESAREAN SECTION      Family History: Family History  Problem Relation Age of Onset  . Breast cancer Mother   . Pancreatic cancer Father   . Seizures Neg Hx     Social History: Social History   Social History  . Marital status: Married    Spouse name: Victoria Stokes  . Number of children: 2  . Years of education: 21   Occupational History  . Lawyer    Social History Main Topics  . Smoking status: Never Smoker  . Smokeless tobacco: Never Used  . Alcohol use  0.0 oz/week     Comment: 2 drinks per month  . Drug use: No  . Sexual activity: Not on file   Other Topics Concern  . Not on file   Social History Narrative   Lives   Caffeine use: 2 cups per day (coffee)   Right handed     Allergies: No Known  Allergies  Medications:  Current Outpatient Prescriptions  Medication Sig Dispense Refill  . B Complex Vitamins (VITAMIN B-COMPLEX PO) Take by mouth.    Marland Kitchen CALCIUM PO Take by mouth.    . Cholecalciferol (VITAMIN D PO) Take by mouth.    . CHOLINE PO Take by mouth.    Marland Kitchen VITAMIN A PO Take by mouth.    Marland Kitchen VITAMIN K PO Take by mouth.    . zolpidem (AMBIEN) 5 MG tablet Take 5 mg by mouth as needed for sleep.    Marland Kitchen ALPRAZolam (XANAX) 0.5 MG tablet Take 2 tablets approximately 45 minutes prior to the MRI study, take a third tablet if needed. (Patient not taking: Reported on 11/20/2016) 3 tablet 0  . levETIRAcetam (KEPPRA) 500 MG tablet Take 1 tablet (500 mg total) by mouth 2 (two) times daily. (Patient not taking: Reported on 11/20/2016) 60 tablet 3   No current facility-administered medications for this visit.     Review of Systems: Review of Systems  Constitutional: Negative.   HENT:   Negative for mouth sores and sore throat.        Sinus pressure  Eyes: Negative.   Respiratory: Negative.   Cardiovascular: Negative.   Gastrointestinal: Negative.   Endocrine: Negative.   Genitourinary: Negative.    Musculoskeletal: Positive for back pain.       Raynaud's phenomenon  Skin: Negative for rash.       Hair thinning  Neurological: Positive for seizures.  Hematological: Negative.   Psychiatric/Behavioral: Negative.      PHYSICAL EXAMINATION Blood pressure 121/70, pulse 70, temperature 98.6 F (37 C), temperature source Oral, resp. rate 18, height '5\' 7"'  (1.702 m), weight 145 lb 12.8 oz (66.1 kg), SpO2 99 %.  ECOG PERFORMANCE STATUS: 1 - Symptomatic but completely ambulatory  Physical Exam  Constitutional: She is oriented to person, place, and time and well-developed, well-nourished, and in no distress. No distress.  HENT:  Head: Normocephalic and atraumatic.  Mouth/Throat: Oropharynx is clear and moist. No oropharyngeal exudate.  Eyes: Pupils are equal, round, and reactive to light.  Conjunctivae and EOM are normal. No scleral icterus.  Neck: Normal range of motion. Neck supple. No thyromegaly present.  Cardiovascular: Normal rate, regular rhythm and normal heart sounds.   No murmur heard. Pulmonary/Chest: Effort normal and breath sounds normal. No respiratory distress. She has no wheezes. She has no rales.  Abdominal: Soft. She exhibits no distension and no mass. There is no tenderness. There is no rebound and no guarding.  Musculoskeletal: Normal range of motion. She exhibits no edema or tenderness.  Lymphadenopathy:    She has no cervical adenopathy.  Neurological: She is alert and oriented to person, place, and time. She has normal reflexes. No cranial nerve deficit.  Skin: Skin is warm and dry. No rash noted. She is not diaphoretic. No erythema. No pallor.     LABORATORY DATA: I have personally reviewed the data as listed: Appointment on 11/20/2016  Component Date Value Ref Range Status  . WBC 11/20/2016 5.0  3.9 - 10.3 10e3/uL Final  . NEUT# 11/20/2016 3.4  1.5 - 6.5 10e3/uL Final  .  HGB 11/20/2016 13.7  11.6 - 15.9 g/dL Final  . HCT 11/20/2016 41.1  34.8 - 46.6 % Final  . Platelets 11/20/2016 284  145 - 400 10e3/uL Final  . MCV 11/20/2016 97.4  79.5 - 101.0 fL Final  . MCH 11/20/2016 32.5  25.1 - 34.0 pg Final  . MCHC 11/20/2016 33.4  31.5 - 36.0 g/dL Final  . RBC 11/20/2016 4.22  3.70 - 5.45 10e6/uL Final  . RDW 11/20/2016 13.1  11.2 - 14.5 % Final  . lymph# 11/20/2016 1.2  0.9 - 3.3 10e3/uL Final  . MONO# 11/20/2016 0.4  0.1 - 0.9 10e3/uL Final  . Eosinophils Absolute 11/20/2016 0.1  0.0 - 0.5 10e3/uL Final  . Basophils Absolute 11/20/2016 0.0  0.0 - 0.1 10e3/uL Final  . NEUT% 11/20/2016 68.1  38.4 - 76.8 % Final  . LYMPH% 11/20/2016 23.3  14.0 - 49.7 % Final  . MONO% 11/20/2016 7.0  0.0 - 14.0 % Final  . EOS% 11/20/2016 1.0  0.0 - 7.0 % Final  . BASO% 11/20/2016 0.6  0.0 - 2.0 % Final  . Sodium 11/20/2016 134* 136 - 145 mEq/L Final  . Potassium  11/20/2016 3.9  3.5 - 5.1 mEq/L Final  . Chloride 11/20/2016 99  98 - 109 mEq/L Final  . CO2 11/20/2016 27  22 - 29 mEq/L Final  . Glucose 11/20/2016 97  70 - 140 mg/dl Final   Glucose reference range is for nonfasting patients. Fasting glucose reference range is 70- 100.  Marland Kitchen BUN 11/20/2016 15.3  7.0 - 26.0 mg/dL Final  . Creatinine 11/20/2016 0.9  0.6 - 1.1 mg/dL Final  . Total Bilirubin 11/20/2016 0.57  0.20 - 1.20 mg/dL Final  . Alkaline Phosphatase 11/20/2016 78  40 - 150 U/L Final  . AST 11/20/2016 23  5 - 34 U/L Final  . ALT 11/20/2016 25  0 - 55 U/L Final  . Total Protein 11/20/2016 8.5* 6.4 - 8.3 g/dL Final  . Albumin 11/20/2016 4.6  3.5 - 5.0 g/dL Final  . Calcium 11/20/2016 9.9  8.4 - 10.4 mg/dL Final  . Anion Gap 11/20/2016 9  3 - 11 mEq/L Final  . EGFR 11/20/2016 71* >90 ml/min/1.73 m2 Final   eGFR is calculated using the CKD-EPI Creatinine Equation (2009)  . LDH 11/20/2016 171  125 - 245 U/L Final  . Uric Acid, Serum 11/20/2016 4.4  2.6 - 7.4 mg/dl Final  . Beta-2 11/20/2016 1.7  0.6 - 2.4 mg/L Final   Siemens Immulite 2000 Immunochemiluminometric assay (ICMA)  . IgG, Qn, Serum 11/20/2016 1,840* 700 - 1600 mg/dL Final  . IgA, Qn, Serum 11/20/2016 203  87 - 352 mg/dL Final  . IgM, Qn, Serum 11/20/2016 86  26 - 217 mg/dL Final  . Total Protein 11/20/2016 7.9  6.0 - 8.5 g/dL Final  . Albumin SerPl Elph-Mcnc 11/20/2016 4.1  2.9 - 4.4 g/dL Final  . Alpha 1 11/20/2016 0.3  0.0 - 0.4 g/dL Final  . Alpha2 Glob SerPl Elph-Mcnc 11/20/2016 0.8  0.4 - 1.0 g/dL Final  . B-Globulin SerPl Elph-Mcnc 11/20/2016 1.1  0.7 - 1.3 g/dL Final  . Gamma Glob SerPl Elph-Mcnc 11/20/2016 1.7  0.4 - 1.8 g/dL Final  . M Protein SerPl Elph-Mcnc 11/20/2016 1.1* Not Observed g/dL Final  . Globulin, Total 11/20/2016 3.8  2.2 - 3.9 g/dL Final  . Albumin/Glob SerPl 11/20/2016 1.1  0.7 - 1.7 Final  . IFE 1 11/20/2016 Comment   Final   Comment: Immunofixation shows IgG  monoclonal protein with kappa light  chain specificity.   . Please Note 11/20/2016 Comment   Final   Comment: Protein electrophoresis scan will follow via computer, mail, or courier delivery.   Loletha Grayer Kappa Free Light Chain 11/20/2016 15.9  3.3 - 19.4 mg/L Final  . Ig Lambda Free Light Chain 11/20/2016 11.9  5.7 - 26.3 mg/L Final  . Kappa/Lambda FluidC Ratio 11/20/2016 1.34  0.26 - 1.65 Final         Ardath Sax, MD

## 2016-11-27 ENCOUNTER — Telehealth: Payer: Self-pay

## 2016-11-27 ENCOUNTER — Ambulatory Visit (HOSPITAL_BASED_OUTPATIENT_CLINIC_OR_DEPARTMENT_OTHER): Payer: BLUE CROSS/BLUE SHIELD | Admitting: Hematology and Oncology

## 2016-11-27 ENCOUNTER — Telehealth: Payer: Self-pay | Admitting: Pharmacy Technician

## 2016-11-27 ENCOUNTER — Telehealth: Payer: Self-pay | Admitting: Hematology and Oncology

## 2016-11-27 ENCOUNTER — Encounter: Payer: Self-pay | Admitting: Hematology and Oncology

## 2016-11-27 VITALS — BP 119/74 | HR 62 | Temp 97.9°F | Resp 18 | Ht 67.0 in | Wt 147.6 lb

## 2016-11-27 DIAGNOSIS — C9000 Multiple myeloma not having achieved remission: Secondary | ICD-10-CM | POA: Diagnosis not present

## 2016-11-27 LAB — UPEP/UIFE/LIGHT CHAINS/TP, 24-HR UR
% BETA, URINE: 0 %
ALBUMIN, U: 0 %
ALPHA 1 URINE: 0 %
ALPHA-2-GLOBULIN, U: 0 %
FREE KAPPA LT CHAINS, UR: 1.66 mg/L (ref 1.35–24.19)
Free Lambda Lt Chains,Ur: <0.05 mg/L — ABNORMAL LOW (ref 0.24–6.66)
GAMMA GLOBULIN URINE: 0 %
Kappa/Lambda Ratio,U: >33.2 — ABNORMAL HIGH (ref 2.04–10.37)
PROTEIN,TOTAL,URINE: 6.1 mg/dL
Prot,24hr calculated: 366 mg/24 hr — ABNORMAL HIGH (ref 30–150)

## 2016-11-27 MED ORDER — LORAZEPAM 0.5 MG PO TABS: 0.5000 mg | ORAL_TABLET | ORAL | 0 refills | Status: DC | PRN

## 2016-11-27 MED ORDER — LENALIDOMIDE 25 MG PO CAPS: ORAL_CAPSULE | ORAL | 3 refills | Status: DC

## 2016-11-27 MED ORDER — MORPHINE SULFATE 15 MG PO TABS: 15.0000 mg | ORAL_TABLET | ORAL | 0 refills | Status: DC | PRN

## 2016-11-27 NOTE — Assessment & Plan Note (Signed)
61 year old female with new discovery of monoclonal gammopathy of unknown significance in the context of compression fracture of T4 vertebral body.  Patient's hematological profile is interesting due to presence off and neutropenia with mild severity and absence of anemia or thrombocytopenia. Coupled with monoclonal gammopathy, differential includes primary hematological malignancy such as multiple myeloma or lymphoplasmacytic lymphoma versus other autoimmune or infectious processes. ANA and rheumatoid factor were negative recently, making primary hematological condition more likely comparatively.  Plan: --Labs as outlined below to eval for Multiple myeloma --Skeletal survey --Return to clinic in 1 week to review the findings.

## 2016-11-27 NOTE — Telephone Encounter (Signed)
Patient scheduled for bone marrow biopsy 12/03/16. Called flow cytometry to confirm staff will be available to come to cancer center for biopsy. Talked to Butch Penny and she confirmed day and time and staff will be available.

## 2016-11-27 NOTE — Telephone Encounter (Signed)
Gave avs and calendar for October and November  °

## 2016-11-27 NOTE — Progress Notes (Signed)
START ON PATHWAY REGIMEN - Multiple Myeloma and Other Plasma Cell Dyscrasias     A cycle is every 21 days:     Bortezomib      Lenalidomide      Dexamethasone   **Always confirm dose/schedule in your pharmacy ordering system**  Patient Characteristics: Newly Diagnosed, Transplant Eligible, Unknown or Awaiting Test Results R-ISS Staging: Unknown Disease Classification: Newly Diagnosed Is Patient Eligible for Transplant<= Transplant Eligible Risk Status: Unknown Intent of Therapy: Curative Intent, Discussed with Patient 

## 2016-11-27 NOTE — Telephone Encounter (Signed)
Staff message sent to Victoria Stokes in reference to Dr. Clydene Laming order for consult [facility] for 2nd opinion, myeloma, bone marrow transplant.

## 2016-11-27 NOTE — Telephone Encounter (Signed)
Oral Oncology Patient Advocate Encounter  Received notification that prior authorization was required for the patient's Revlimid.  This request was submitted on Cover My Meds (Key: [id number]) and this request has been approved.      Effective dates: 11/27/2016 through 11/26/2017.  Oral Oncology Clinic will continue to follow.   Fabio Asa. Melynda Keller, [place] Patient [place] [phone] 11/27/2016 3:31 PM

## 2016-11-28 ENCOUNTER — Telehealth (HOSPITAL_COMMUNITY): Payer: Self-pay | Admitting: Dentistry

## 2016-11-28 NOTE — Telephone Encounter (Signed)
11/28/16  Called to schl. Dental Consult w/ Dr. Enrique Sack .  Pt. unable to shcl. Appt. at this time.  Pt. will call Dental Medicine back next week.    LRI

## 2016-11-30 ENCOUNTER — Telehealth: Payer: Self-pay | Admitting: Pharmacist

## 2016-11-30 ENCOUNTER — Telehealth: Payer: Self-pay

## 2016-11-30 ENCOUNTER — Other Ambulatory Visit: Payer: Self-pay

## 2016-11-30 ENCOUNTER — Encounter: Payer: Self-pay | Admitting: Hematology and Oncology

## 2016-11-30 ENCOUNTER — Telehealth: Payer: Self-pay | Admitting: Hematology and Oncology

## 2016-11-30 ENCOUNTER — Ambulatory Visit (HOSPITAL_BASED_OUTPATIENT_CLINIC_OR_DEPARTMENT_OTHER): Payer: BLUE CROSS/BLUE SHIELD | Admitting: Hematology and Oncology

## 2016-11-30 VITALS — BP 134/94 | HR 73 | Temp 97.8°F | Resp 18 | Wt 146.7 lb

## 2016-11-30 DIAGNOSIS — C9000 Multiple myeloma not having achieved remission: Secondary | ICD-10-CM

## 2016-11-30 DIAGNOSIS — F419 Anxiety disorder, unspecified: Secondary | ICD-10-CM | POA: Diagnosis not present

## 2016-11-30 MED ORDER — LENALIDOMIDE 25 MG PO CAPS: ORAL_CAPSULE | ORAL | 0 refills | Status: DC

## 2016-11-30 NOTE — Telephone Encounter (Signed)
Referral for second opinion called to Amy at Dr. Tressie Stalker office in [facility]. Patient requested Dr. Alvie Heidelberg. Information faxed to Amy at [phone]. Confirmation page that fax was received.

## 2016-11-30 NOTE — Telephone Encounter (Signed)
Oral Oncology Pharmacist Encounter  Received new prescription for Revlimid for the treatment of multiple myeloma in conjunction with Velcade and dexamethasone, planned duration 6-8 cycles. Planned start date: 12/18/16  Labs from 11/20/16 assessed, OK for treatment.  Current medication list in Epic reviewed, no DDIs with Revlimid identified. No acyclovir for VZV prophylaxis or aspirin for thromboprophylaxis noted on medication list. Patient also will need oral dexamethasone prescription. This will be discussed with MD.  Prescription has been e-scribed to [place] for benefits analysis and approval in insurance requirement.  Oral Oncology Clinic will continue to follow for insurance authorization, copayment issues, initial counseling and start date.  Johny Drilling, PharmD, BCPS, BCOP 11/30/2016 4:28 PM Oral Oncology Clinic [phone]

## 2016-11-30 NOTE — Telephone Encounter (Signed)
No 10/19 los.  

## 2016-12-03 ENCOUNTER — Ambulatory Visit (HOSPITAL_BASED_OUTPATIENT_CLINIC_OR_DEPARTMENT_OTHER): Payer: BLUE CROSS/BLUE SHIELD | Admitting: Hematology and Oncology

## 2016-12-03 ENCOUNTER — Telehealth: Payer: Self-pay | Admitting: Hematology and Oncology

## 2016-12-03 ENCOUNTER — Ambulatory Visit: Payer: BLUE CROSS/BLUE SHIELD

## 2016-12-03 ENCOUNTER — Telehealth: Payer: Self-pay

## 2016-12-03 ENCOUNTER — Other Ambulatory Visit (HOSPITAL_BASED_OUTPATIENT_CLINIC_OR_DEPARTMENT_OTHER): Payer: BLUE CROSS/BLUE SHIELD | Admitting: *Deleted

## 2016-12-03 ENCOUNTER — Encounter: Payer: Self-pay | Admitting: Hematology and Oncology

## 2016-12-03 ENCOUNTER — Other Ambulatory Visit (HOSPITAL_COMMUNITY)
Admission: RE | Admit: 2016-12-03 | Discharge: 2016-12-03 | Disposition: A | Discharge status: Home / Self Care | Payer: BLUE CROSS/BLUE SHIELD | Source: Ambulatory Visit | Attending: Hematology and Oncology | Admitting: Hematology and Oncology

## 2016-12-03 DIAGNOSIS — D72822 Plasmacytosis: Secondary | ICD-10-CM | POA: Insufficient documentation

## 2016-12-03 DIAGNOSIS — C9000 Multiple myeloma not having achieved remission: Secondary | ICD-10-CM | POA: Diagnosis not present

## 2016-12-03 LAB — CBC WITH DIFFERENTIAL/PLATELET
BASO%: 0.3 % (ref 0.0–2.0)
Basophils Absolute: 0 10*3/uL (ref 0.0–0.1)
EOS%: 0.8 % (ref 0.0–7.0)
Eosinophils Absolute: 0 10*3/uL (ref 0.0–0.5)
HEMATOCRIT: 39.8 % (ref 34.8–46.6)
HEMOGLOBIN: 13.7 g/dL (ref 11.6–15.9)
LYMPH#: 0.9 10*3/uL (ref 0.9–3.3)
LYMPH%: 19.4 % (ref 14.0–49.7)
MCH: 33.2 pg (ref 25.1–34.0)
MCHC: 34.4 g/dL (ref 31.5–36.0)
MCV: 96.5 fL (ref 79.5–101.0)
MONO#: 0.4 10*3/uL (ref 0.1–0.9)
MONO%: 9.9 % (ref 0.0–14.0)
NEUT%: 69.6 % (ref 38.4–76.8)
NEUTROS ABS: 3 10*3/uL (ref 1.5–6.5)
Platelets: 273 10*3/uL (ref 145–400)
RBC: 4.12 10*6/uL (ref 3.70–5.45)
RDW: 12.9 % (ref 11.2–14.5)
WBC: 4.4 10*3/uL (ref 3.9–10.3)

## 2016-12-03 NOTE — Telephone Encounter (Signed)
Scheduled appt per 10/22 los and sch message - left message with appt date and time.

## 2016-12-03 NOTE — Assessment & Plan Note (Signed)
61 y.o. female with initial presentation of monoclonal gammopathy of unknown significance in the context of compression fracture of T4 vertebral body, returning after completing initial workup possible multiple myeloma.  Unfortunately, our workup is consistent with multiple myeloma based on presence of monoclonal demography with IgG A predominance as well as lytic lesions throughout the skeleton.patient has no significant renal dysfunction and no hematological abnormalities. At this time, we need to conduct information of the diagnosis by bone marrow biopsy. Initial recommendations are outlined below.  Plan: --BM Bx in the [facility] on RTC: morphology evaluation, cytogenetic analysis, MM FISH for appropriate risk stratification --Consult Dentistry for valuation of safety of zoledronic acid use --consult orthopedic surgery regarding bilateral femoral lesions -- if these would require internal fixation for prophylaxis or fracture, surgery needs to proceed to initiation of systemic anticancer therapy or initiation of zoledronic acid. --patient is reasonably young and very healthy. My plan in the future would be to proceed with induction therapy consisting of combination of lenalidomide, bortezomib, and low-dose dexamethasone followed by possible consolidation with high-dose chemotherapy and autologous stem cell rescue. Whether patient will require maintenance therapy subsequently remains to be seen based on evaluation of the bone marrow. --Second opinion at [employer] on patient's request and with my full support 

## 2016-12-03 NOTE — Telephone Encounter (Signed)
Patient presents today for bone marrow biopsy. Informed consent signed. Time out completed. Flow cytology present. Vitals WNL.  Cyndia Bent RN

## 2016-12-03 NOTE — Progress Notes (Signed)
[place] [facility] Follow-up Visit:  Assessment: Multiple myeloma not having achieved remission [facility]) 61 y.o. female with initial presentation of monoclonal gammopathy of unknown significance in the context of compression fracture of T4 vertebral body, returning after completing initial workup possible multiple myeloma.  Unfortunately, our workup is consistent with multiple myeloma based on presence of monoclonal demography with IgG A predominance as well as lytic lesions throughout the skeleton.patient has no significant renal dysfunction and no hematological abnormalities. At this time, we need to conduct information of the diagnosis by bone marrow biopsy. Initial recommendations are outlined below.  Plan: --BM Bx in the [facility] on RTC: morphology evaluation, cytogenetic analysis, MM FISH for appropriate risk stratification --Consult Dentistry for valuation of safety of zoledronic acid use --consult orthopedic surgery regarding bilateral femoral lesions -- if these would require internal fixation for prophylaxis or fracture, surgery needs to proceed to initiation of systemic anticancer therapy or initiation of zoledronic acid. --patient is reasonably young and very healthy. My plan in the future would be to proceed with induction therapy consisting of combination of lenalidomide, bortezomib, and low-dose dexamethasone followed by possible consolidation with high-dose chemotherapy and autologous stem cell rescue. Whether patient will require maintenance therapy subsequently remains to be seen based on evaluation of the bone marrow. --Second opinion at [facility] on patient's request and with my full support  Voice recognition software was used and creation of this note. Despite my best effort at editing the text, some misspelling/errors may have occurred.   Orders Placed This Encounter  Procedures  . Biopsy bone marrow    Standing Status:   Future    Standing Expiration Date:    11/27/2017  . Ambulatory referral to Dentistry    Referral Priority:   Urgent    Referral Type:   Consultation    Referral Reason:   Specialty Services Required    Requested Specialty:   Dental General Practice    Number of Visits Requested:   1  . AMB referral to orthopedics    Referral Priority:   Urgent    Referral Type:   Consultation    Number of Visits Requested:   1  . Ambulatory referral to Hematology / Oncology    Referral Priority:   Routine    Referral Type:   Consultation    Referral Reason:   Second Opinion    Referral Location:   [place]    Requested Specialty:   Hematology    Number of Visits Requested:   1    Cancer Staging Multiple myeloma not having achieved remission ([place]) Staging form: Plasma Cell Myeloma and Plasma Cell Disorders, AJCC 8th Edition - Clinical: Beta-2-microglobulin (mg/L): 1.7, Albumin (g/dL): 4.6, ISS: Stage I, LDH: Normal - Unsigned   All questions were answered. . The patient knows to call the clinic with any problems, questions or concerns.  This note was electronically signed.    History of Presenting Illness Victoria Stokes is a 61 y.o. female followed in the [place] for Diagnosis of multiple myeloma. Patient has history of infrequent seizure events with the last one occurring on Father's Day in 2018 following a period of outdoors activity with dehydration. Previous events occurred in 1987 and 1985 and similar circumstances and were all associated with significant hyponatremia. In the interim, patient has been seizure-free without any antiseizure medications. Following the latest seizure event, patient has developed significant discomfort in the mid back. The area had discomfort in the past, but  severity has increased. Imaging was obtained demonstrating a compression fracture at T4 with unknown acuity. At this time, pain and discomfort have resolved. Patient denies weakness or paresthesias in the lower extremities. Denies any  paresthesia or her bladder or bowel. Patient also indicates Raynaud's phenomenon in bilateral hands, some thinning of her hair, but denies any oral sores. She does have chronic sinus pressure with sinus drainage and sore throat. No significant febrile illness, and no epistaxis or purulent nasal drainage.  Due to a discovery on a compression fracture, additional lab work was obtained demonstrating presence of monoclonal gammopathy leading to referral to our service. Please see the lab work below for details. Subsequently, additional assessment including repeat lab work and additional imaging were obtained. These demonstrate multiple scattered lytic lesions as described below.  Patient returns to the clinic to discuss the findings and plan further evaluation and management plan. She denies any new complaints since last visit to the clinic.  Oncological/hematological History: --Labs, 01/22/12:                                                                                                            WBC 3.0, ANC 1.3, ALC 1.2, Mono 0.3, Eos 0.2, Baso 0.0,  Hgb 12.8, Plt 228; --Labs, 08/01/16: tProt   ..., Alb   ..., Ca 9.7, Cr 0.8, AP  .Marland Kitchen.;                                           WBC 5.0, Nl diff,                                                                     Hgb 13.6, Plt 307 --Labs, 11/12/16: tProt 7.3, Alb 4.4, Ca 9.8, Cr 0.8, AP 61; SPEP -- M-Spike 1.1g/dL; WBC 3.3, ANC 1.6, ALC 1.3, Mono 0.3, Eos 0.1, Baso 0.0, Hgb 14.3, Plt 248; ANA & RF negative    Multiple myeloma not having achieved remission ([facility])   11/20/2016 Tumor Marker    tProt 8.5, Alb 4.6, Ca 9.9, Cr 0.9, AP 78; LDH 171, beta-2 microglobulin 1.7 SPEP -- M-spike 1.1g/dL, SIFE -- IgG kappa IgG 1840, IgA 203, IgM 86; kappa 15.9, lambda 11.9, KLR 1.34 WBC 5.0, Hgb 13.7, Plt 284       11/26/2016 Imaging    Skeletal Survey: Scattered lytic lesions noted in the skull, lateral right ischium, proximal right femur, in mid right tibia.  Areas of periarticular osteoporosis noted.      11/27/2016 Initial Diagnosis    Multiple myeloma not having achieved remission [facility])       Medical History: Past Medical History:  Diagnosis Date  . Low sodium levels    Hx  .  Seizures ([place]) 10/29/2016    Surgical History: Past Surgical History:  Procedure Laterality Date  . CESAREAN SECTION      Family History: Family History  Problem Relation Age of Onset  . Breast cancer Mother   . Pancreatic cancer Father   . Seizures Neg Hx     Social History: Social History   Social History  . Marital status: Married    Spouse name: Collier Salina  . Number of children: 2  . Years of education: 21   Occupational History  . Lawyer    Social History Main Topics  . Smoking status: Never Smoker  . Smokeless tobacco: Never Used  . Alcohol use 0.0 oz/week     Comment: 2 drinks per month  . Drug use: No  . Sexual activity: Not on file   Other Topics Concern  . Not on file   Social History Narrative   Lives   Caffeine use: 2 cups per day (coffee)   Right handed     Allergies: No Known Allergies  Medications:  Current Outpatient Prescriptions  Medication Sig Dispense Refill  . B Complex Vitamins (VITAMIN B-COMPLEX PO) Take by mouth.    Marland Kitchen CALCIUM PO Take by mouth.    . Cholecalciferol (VITAMIN D PO) Take by mouth.    . CHOLINE PO Take by mouth.    Marland Kitchen VITAMIN A PO Take by mouth.    Marland Kitchen VITAMIN K PO Take by mouth.    . zolpidem (AMBIEN) 5 MG tablet Take 5 mg by mouth as needed for sleep.    Marland Kitchen lenalidomide (REVLIMID) 25 MG capsule Take 1 capsule by mouth once daily on days 1-14 every 21 days. Auth# [id number] 14 capsule 0  . levETIRAcetam (KEPPRA) 500 MG tablet Take 1 tablet (500 mg total) by mouth 2 (two) times daily. 60 tablet 3  . LORazepam (ATIVAN) 0.5 MG tablet Take 1 tablet (0.5 mg total) by mouth every 4 (four) hours as needed for anxiety. 2 tablet 0  . morphine (MSIR) 15 MG tablet Take 1 tablet (15 mg total) by mouth every 4  (four) hours as needed for severe pain. 5 tablet 0   No current facility-administered medications for this visit.     Review of Systems: Review of Systems  Constitutional: Negative.   HENT:   Negative for mouth sores and sore throat.        Sinus pressure  Eyes: Negative.   Respiratory: Negative.   Cardiovascular: Negative.   Gastrointestinal: Negative.   Endocrine: Negative.   Genitourinary: Negative.    Musculoskeletal: Positive for back pain.       Raynaud's phenomenon  Skin: Negative for rash.       Hair thinning  Neurological: Positive for seizures.  Hematological: Negative.   Psychiatric/Behavioral: Negative.      PHYSICAL EXAMINATION Blood pressure 119/74, pulse 62, temperature 97.9 F (36.6 C), temperature source Oral, resp. rate 18, height _0  (1.702 m), weight 147 lb 9.6 oz (67 kg), SpO2 100 %.  ECOG PERFORMANCE STATUS: 0 - Asymptomatic  Physical Exam  Constitutional: She is oriented to person, place, and time and well-developed, well-nourished, and in no distress. No distress.  HENT:  Head: Normocephalic and atraumatic.  Mouth/Throat: Oropharynx is clear and moist. No oropharyngeal exudate.  Eyes: Pupils are equal, round, and reactive to light. Conjunctivae and EOM are normal. No scleral icterus.  Neck: Normal range of motion. Neck supple. No thyromegaly present.  Cardiovascular: Normal rate, regular rhythm and normal heart sounds.  No murmur heard. Pulmonary/Chest: Effort normal and breath sounds normal. No respiratory distress. She has no wheezes. She has no rales.  Abdominal: Soft. She exhibits no distension and no mass. There is no tenderness. There is no rebound and no guarding.  Musculoskeletal: Normal range of motion. She exhibits no edema or tenderness.  Lymphadenopathy:    She has no cervical adenopathy.  Neurological: She is alert and oriented to person, place, and time. She has normal reflexes. No cranial nerve deficit.  Skin: Skin is warm and  dry. No rash noted. She is not diaphoretic. No erythema. No pallor.     LABORATORY DATA: I have personally reviewed the data as listed: No visits with results within 1 Week(s) from this visit.  Latest known visit with results is:  Appointment on 11/20/2016  Component Date Value Ref Range Status  . WBC 11/20/2016 5.0  3.9 - 10.3 10e3/uL Final  . NEUT# 11/20/2016 3.4  1.5 - 6.5 10e3/uL Final  . HGB 11/20/2016 13.7  11.6 - 15.9 g/dL Final  . HCT 11/20/2016 41.1  34.8 - 46.6 % Final  . Platelets 11/20/2016 284  145 - 400 10e3/uL Final  . MCV 11/20/2016 97.4  79.5 - 101.0 fL Final  . MCH 11/20/2016 32.5  25.1 - 34.0 pg Final  . MCHC 11/20/2016 33.4  31.5 - 36.0 g/dL Final  . RBC 11/20/2016 4.22  3.70 - 5.45 10e6/uL Final  . RDW 11/20/2016 13.1  11.2 - 14.5 % Final  . lymph# 11/20/2016 1.2  0.9 - 3.3 10e3/uL Final  . MONO# 11/20/2016 0.4  0.1 - 0.9 10e3/uL Final  . Eosinophils Absolute 11/20/2016 0.1  0.0 - 0.5 10e3/uL Final  . Basophils Absolute 11/20/2016 0.0  0.0 - 0.1 10e3/uL Final  . NEUT% 11/20/2016 68.1  38.4 - 76.8 % Final  . LYMPH% 11/20/2016 23.3  14.0 - 49.7 % Final  . MONO% 11/20/2016 7.0  0.0 - 14.0 % Final  . EOS% 11/20/2016 1.0  0.0 - 7.0 % Final  . BASO% 11/20/2016 0.6  0.0 - 2.0 % Final  . Sodium 11/20/2016 134* 136 - 145 mEq/L Final  . Potassium 11/20/2016 3.9  3.5 - 5.1 mEq/L Final  . Chloride 11/20/2016 99  98 - 109 mEq/L Final  . CO2 11/20/2016 27  22 - 29 mEq/L Final  . Glucose 11/20/2016 97  70 - 140 mg/dl Final   Glucose reference range is for nonfasting patients. Fasting glucose reference range is 70- 100.  Marland Kitchen BUN 11/20/2016 15.3  7.0 - 26.0 mg/dL Final  . Creatinine 11/20/2016 0.9  0.6 - 1.1 mg/dL Final  . Total Bilirubin 11/20/2016 0.57  0.20 - 1.20 mg/dL Final  . Alkaline Phosphatase 11/20/2016 78  40 - 150 U/L Final  . AST 11/20/2016 23  5 - 34 U/L Final  . ALT 11/20/2016 25  0 - 55 U/L Final  . Total Protein 11/20/2016 8.5* 6.4 - 8.3 g/dL Final  .  Albumin 11/20/2016 4.6  3.5 - 5.0 g/dL Final  . Calcium 11/20/2016 9.9  8.4 - 10.4 mg/dL Final  . Anion Gap 11/20/2016 9  3 - 11 mEq/L Final  . EGFR 11/20/2016 71* >90 ml/min/1.73 m2 Final   eGFR is calculated using the CKD-EPI Creatinine Equation (2009)  . LDH 11/20/2016 171  125 - 245 U/L Final  . Uric Acid, Serum 11/20/2016 4.4  2.6 - 7.4 mg/dl Final  . Beta-2 11/20/2016 1.7  0.6 - 2.4 mg/L Final   Siemens Immulite 2000  Immunochemiluminometric assay (ICMA)  . IgG, Qn, Serum 11/20/2016 1,840* 700 - 1600 mg/dL Final  . IgA, Qn, Serum 11/20/2016 203  87 - 352 mg/dL Final  . IgM, Qn, Serum 11/20/2016 86  26 - 217 mg/dL Final  . Total Protein 11/20/2016 7.9  6.0 - 8.5 g/dL Final  . Albumin SerPl Elph-Mcnc 11/20/2016 4.1  2.9 - 4.4 g/dL Final  . Alpha 1 11/20/2016 0.3  0.0 - 0.4 g/dL Final  . Alpha2 Glob SerPl Elph-Mcnc 11/20/2016 0.8  0.4 - 1.0 g/dL Final  . B-Globulin SerPl Elph-Mcnc 11/20/2016 1.1  0.7 - 1.3 g/dL Final  . Gamma Glob SerPl Elph-Mcnc 11/20/2016 1.7  0.4 - 1.8 g/dL Final  . M Protein SerPl Elph-Mcnc 11/20/2016 1.1* Not Observed g/dL Final  . Globulin, Total 11/20/2016 3.8  2.2 - 3.9 g/dL Final  . Albumin/Glob SerPl 11/20/2016 1.1  0.7 - 1.7 Final  . IFE 1 11/20/2016 Comment   Final   Comment: Immunofixation shows IgG monoclonal protein with kappa light chain specificity.   . Please Note 11/20/2016 Comment   Final   Comment: Protein electrophoresis scan will follow via computer, mail, or courier delivery.   Loletha Grayer Kappa Free Light Chain 11/20/2016 15.9  3.3 - 19.4 mg/L Final  . Ig Lambda Free Light Chain 11/20/2016 11.9  5.7 - 26.3 mg/L Final  . Kappa/Lambda FluidC Ratio 11/20/2016 1.34  0.26 - 1.65 Final  . PROTEIN,TOTAL,URINE 11/23/2016 6.1  Not Estab. mg/dL Final   Total Volume: 6000 mL  . Prot,24hr calculated 11/23/2016 366* 30 - 150 mg/24 hr Final  . ALBUMIN, U 11/23/2016 0.0  % Final   Comment: No urine protein electrophoretic pattern was observed  after concentration. However, there is a detectable amount of protein by a quantitative spectrophotometric method.   . ALPHA 1 URINE 11/23/2016 0.0  % Final  . ALPHA-2-GLOBULIN, U 11/23/2016 0.0  % Final  . % BETA, Urine 11/23/2016 0.0  % Final  . GAMMA GLOBULIN URINE 11/23/2016 0.0  % Final  . M-SPIKE, % 11/23/2016 Not Observed  Not Observed % Final  . Immunofixation Result, Urine 11/23/2016 Comment   Final   An apparent normal immunofixation pattern.  Marland Kitchen NOTE: 11/23/2016 Comment   Final   Comment: Protein electrophoresis scan will follow via computer, mail, or courier delivery.   . Free Kappa Lt Chains,Ur 11/23/2016 1.66  1.35 - 24.19 mg/L Final   **Results verified by repeat testing**  . Free Lambda Lt Chains,Ur 11/23/2016 <0.05* 0.24 - 6.66 mg/L Final   **Results verified by repeat testing**  . Kappa/Lambda Ratio,U 11/23/2016 >33.20* 2.04 - 10.37 Final       Ardath Sax, MD

## 2016-12-03 NOTE — Progress Notes (Signed)
Bone Marrow Biopsy & Aspiration   Patient presented to the cancer center today to undergo bone marrow aspiration and biopsy in the context of workup for diagnosis of multiple myeloma.  The conduct of procedure including purpose of the procedure, medications used, technique applied, and possible complications including pain, bleeding, and infection discussed with the patient has signed informed consent to proceed as planned. Timeout conducted using tentative fires of the patient's name and date of birth and confirmed by pathology technician present.  Patient placed in the prone position, using landmarks, posterior-superior iliac spine on the right side was identified and marked. Procedural field was cleaned with Betadine and dressed with a sterile dressing. 0.3 cm incision made with scalpel after the skin was anesthetized with 2% lidocaine. Overall, 10 mL of lidocaine were used during the conduct of the procedure to provide anesthesia to the surface of the skin and surface of the bone. Initially, core biopsy needle was introduced into the pelvic bone and a sample of bone marrow obtained. Subsequently, repeat biopsy needle was introduced and aspirate obtained for additional studies. Patient reported minimal levels of discomfort throughout the procedure. Blood loss was negligible. Pressure dressing applied, patient discharged in stable condition.  Our plan is to follow-up with the patient in 1 week to discuss results of the bone marrow biopsy.

## 2016-12-04 ENCOUNTER — Telehealth: Payer: Self-pay | Admitting: *Deleted

## 2016-12-04 ENCOUNTER — Encounter: Payer: Self-pay | Admitting: *Deleted

## 2016-12-04 ENCOUNTER — Telehealth: Payer: Self-pay

## 2016-12-04 ENCOUNTER — Other Ambulatory Visit: Payer: BLUE CROSS/BLUE SHIELD

## 2016-12-04 NOTE — Telephone Encounter (Signed)
No additional note

## 2016-12-04 NOTE — Telephone Encounter (Signed)
Spoke with Amy, Network engineer, at [facility]. Fax sent to the office of Dr. Alvie Heidelberg on 11/30/16 and confirmed fax receipt at 1330. Dr. Kendell Bane team was given referral paperwork yesterday and Amy expects return to her today so that the pt can be called and scheduled. Shared with Amy that the pt is scheduled for treatment start on 11/6. Our office should expect a call back with pt chosen date and time of appt at [facility] for second opinion.   Pt requesting information from [place], [employer] RN, staff message sent to [place] with communication to relay to pt.

## 2016-12-05 ENCOUNTER — Encounter: Payer: Self-pay | Admitting: Hematology and Oncology

## 2016-12-05 ENCOUNTER — Telehealth: Payer: Self-pay

## 2016-12-05 NOTE — Telephone Encounter (Signed)
Pt was calling about her referral to [facility]. Assured her that Aldona Bar did talk to [place] and they will be calling her.

## 2016-12-06 ENCOUNTER — Telehealth: Payer: Self-pay

## 2016-12-06 ENCOUNTER — Other Ambulatory Visit: Payer: Self-pay

## 2016-12-06 NOTE — Assessment & Plan Note (Signed)
61 y.o. female with initial presentation of monoclonal gammopathy of unknown significance in the context of compression fracture of T4 vertebral body, returning after completing initial workup possible multiple myeloma.  Unfortunately, our workup is consistent with multiple myeloma based on presence of monoclonal demography with IgG A predominance as well as lytic lesions throughout the skeleton.patient has no significant renal dysfunction and no hematological abnormalities. At this time, we need to conduct information of the diagnosis by bone marrow biopsy. Initial recommendations are outlined below.  Plan: --BM Bx in the [facility] on RTC: morphology evaluation, cytogenetic analysis, MM FISH for appropriate risk stratification --Consult Dentistry for valuation of safety of zoledronic acid use --consult orthopedic surgery regarding bilateral femoral lesions -- if these would require internal fixation for prophylaxis or fracture, surgery needs to proceed to initiation of systemic anticancer therapy or initiation of zoledronic acid. --patient is reasonably young and very healthy. My plan in the future would be to proceed with induction therapy consisting of combination of lenalidomide, bortezomib, and low-dose dexamethasone followed by possible consolidation with high-dose chemotherapy and autologous stem cell rescue. Whether patient will require maintenance therapy subsequently remains to be seen based on evaluation of the bone marrow. --Second opinion at [facility] on patient's request and with my full support

## 2016-12-06 NOTE — Progress Notes (Signed)
[place] [facility] Follow-up Visit:  Assessment: Multiple myeloma not having achieved remission [facility]) 61 y.o. female with initial presentation of monoclonal gammopathy of unknown significance in the context of compression fracture of T4 vertebral body, returning after completing initial workup possible multiple myeloma.  Unfortunately, our workup is consistent with multiple myeloma based on presence of monoclonal demography with IgG A predominance as well as lytic lesions throughout the skeleton.patient has no significant renal dysfunction and no hematological abnormalities. At this time, we need to conduct information of the diagnosis by bone marrow biopsy. Initial recommendations are outlined below.  Plan: --BM Bx in the [facility] on RTC: morphology evaluation, cytogenetic analysis, MM FISH for appropriate risk stratification --Consult Dentistry for valuation of safety of zoledronic acid use --consult orthopedic surgery regarding bilateral femoral lesions -- if these would require internal fixation for prophylaxis or fracture, surgery needs to proceed to initiation of systemic anticancer therapy or initiation of zoledronic acid. --patient is reasonably young and very healthy. My plan in the future would be to proceed with induction therapy consisting of combination of lenalidomide, bortezomib, and low-dose dexamethasone followed by possible consolidation with high-dose chemotherapy and autologous stem cell rescue. Whether patient will require maintenance therapy subsequently remains to be seen based on evaluation of the bone marrow. --Second opinion at [facility] on patient's request and with my full support  Voice recognition software was used and creation of this note. Despite my best effort at editing the text, some misspelling/errors may have occurred.   Orders Placed This Encounter  Procedures  . Ambulatory referral to Hematology / Oncology    Referral Priority:   Urgent     Referral Type:   Consultation    Referral Reason:   Specialty Services Required    Referral Location:   [facility]    Number of Visits Requested:   1    Cancer Staging Multiple myeloma not having achieved remission ([place]) Staging form: Plasma Cell Myeloma and Plasma Cell Disorders, AJCC 8th Edition - Clinical: Beta-2-microglobulin (mg/L): 1.7, Albumin (g/dL): 4.6, ISS: Stage I, LDH: Normal - Unsigned   All questions were answered. . The patient knows to call the clinic with any problems, questions or concerns.  This note was electronically signed.    History of Presenting Illness Victoria Stokes is a 61 y.o. female followed in the [facility] for Diagnosis of multiple myeloma. Patient has history of infrequent seizure events with the last one occurring on Father's Day in 2018 following a period of outdoors activity with dehydration. Previous events occurred in 1987 and 1985 and similar circumstances and were all associated with significant hyponatremia. In the interim, patient has been seizure-free without any antiseizure medications. Following the latest seizure event, patient has developed significant discomfort in the mid back. The area had discomfort in the past, but severity has increased. Imaging was obtained demonstrating a compression fracture at T4 with unknown acuity. At this time, pain and discomfort have resolved. Patient denies weakness or paresthesias in the lower extremities. Denies any paresthesia or her bladder or bowel. Patient also indicates Raynaud's phenomenon in bilateral hands, some thinning of her hair, but denies any oral sores. She does have chronic sinus pressure with sinus drainage and sore throat. No significant febrile illness, and no epistaxis or purulent nasal drainage.  Due to a discovery on a compression fracture, additional lab work was obtained demonstrating presence of monoclonal gammopathy leading to referral to our service. Please see the  lab work below  for details. Subsequently, additional assessment including repeat lab work and additional imaging were obtained. These demonstrate multiple scattered lytic lesions as described below.  Patient returns to the clinic to discuss the findings and plan further evaluation and management plan. She denies any new complaints since last visit to the clinic.  Oncological/hematological History: --Labs, 01/22/12:                                                                                                            WBC 3.0, ANC 1.3, ALC 1.2, Mono 0.3, Eos 0.2, Baso 0.0,  Hgb 12.8, Plt 228; --Labs, 08/01/16: tProt   ..., Alb   ..., Ca 9.7, Cr 0.8, AP  .Marland Kitchen.;                                           WBC 5.0, Nl diff,                                                                     Hgb 13.6, Plt 307 --Labs, 11/12/16: tProt 7.3, Alb 4.4, Ca 9.8, Cr 0.8, AP 61; SPEP -- M-Spike 1.1g/dL; WBC 3.3, ANC 1.6, ALC 1.3, Mono 0.3, Eos 0.1, Baso 0.0, Hgb 14.3, Plt 248; ANA & RF negative    Multiple myeloma not having achieved remission (HCC)   11/20/2016 Tumor Marker    tProt 8.5, Alb 4.6, Ca 9.9, Cr 0.9, AP 78; LDH 171, beta-2 microglobulin 1.7 SPEP -- M-spike 1.1g/dL, SIFE -- IgG kappa IgG 1840, IgA 203, IgM 86; kappa 15.9, lambda 11.9, KLR 1.34 WBC 5.0, Hgb 13.7, Plt 284       11/26/2016 Imaging    Skeletal Survey: Scattered lytic lesions noted in the skull, lateral right ischium, proximal right femur, in mid right tibia. Areas of periarticular osteoporosis noted.      11/27/2016 Initial Diagnosis    Multiple myeloma not having achieved remission [facility])       Medical History: Past Medical History:  Diagnosis Date  . Low sodium levels    Hx  . Seizures ([place]) 10/29/2016    Surgical History: Past Surgical History:  Procedure Laterality Date  . CESAREAN SECTION      Family History: Family History  Problem Relation Age of Onset  . Breast cancer Mother   . Pancreatic cancer Father    . Seizures Neg Hx     Social History: Social History   Social History  . Marital status: Married    Spouse name: Collier Salina  . Number of children: 2  . Years of education: 21   Occupational History  . Lawyer    Social History Main Topics  . Smoking status: Never Smoker  . Smokeless tobacco: Never  Used  . Alcohol use 0.0 oz/week     Comment: 2 drinks per month  . Drug use: No  . Sexual activity: Not on file   Other Topics Concern  . Not on file   Social History Narrative   Lives   Caffeine use: 2 cups per day (coffee)   Right handed     Allergies: No Known Allergies  Medications:  Current Outpatient Prescriptions  Medication Sig Dispense Refill  . B Complex Vitamins (VITAMIN B-COMPLEX PO) Take by mouth.    Marland Kitchen CALCIUM PO Take by mouth.    . Cholecalciferol (VITAMIN D PO) Take by mouth.    . CHOLINE PO Take by mouth.    . levETIRAcetam (KEPPRA) 500 MG tablet Take 1 tablet (500 mg total) by mouth 2 (two) times daily. 60 tablet 3  . LORazepam (ATIVAN) 0.5 MG tablet Take 1 tablet (0.5 mg total) by mouth every 4 (four) hours as needed for anxiety. 2 tablet 0  . morphine (MSIR) 15 MG tablet Take 1 tablet (15 mg total) by mouth every 4 (four) hours as needed for severe pain. 5 tablet 0  . VITAMIN A PO Take by mouth.    Marland Kitchen VITAMIN K PO Take by mouth.    . zolpidem (AMBIEN) 5 MG tablet Take 5 mg by mouth as needed for sleep.    Marland Kitchen lenalidomide (REVLIMID) 25 MG capsule Take 1 capsule by mouth once daily on days 1-14 every 21 days. Auth# [id number] 14 capsule 0   No current facility-administered medications for this visit.     Review of Systems: Review of Systems  Constitutional: Negative.   HENT:   Negative for mouth sores and sore throat.        Sinus pressure  Eyes: Negative.   Respiratory: Negative.   Cardiovascular: Negative.   Gastrointestinal: Negative.   Endocrine: Negative.   Genitourinary: Negative.    Musculoskeletal: Positive for back pain.       Raynaud's  phenomenon  Skin: Negative for rash.       Hair thinning  Neurological: Positive for seizures.  Hematological: Negative.   Psychiatric/Behavioral: Negative.      PHYSICAL EXAMINATION Blood pressure (!) 134/94, pulse 73, temperature 97.8 F (36.6 C), temperature source Oral, resp. rate 18, weight 146 lb 11.2 oz (66.5 kg), SpO2 99 %.  ECOG PERFORMANCE STATUS: 0 - Asymptomatic  Physical Exam  Constitutional: She is oriented to person, place, and time and well-developed, well-nourished, and in no distress. No distress.  HENT:  Head: Normocephalic and atraumatic.  Mouth/Throat: Oropharynx is clear and moist. No oropharyngeal exudate.  Eyes: Pupils are equal, round, and reactive to light. Conjunctivae and EOM are normal. No scleral icterus.  Neck: Normal range of motion. Neck supple. No thyromegaly present.  Cardiovascular: Normal rate, regular rhythm and normal heart sounds.   No murmur heard. Pulmonary/Chest: Effort normal and breath sounds normal. No respiratory distress. She has no wheezes. She has no rales.  Abdominal: Soft. She exhibits no distension and no mass. There is no tenderness. There is no rebound and no guarding.  Musculoskeletal: Normal range of motion. She exhibits no edema or tenderness.  Lymphadenopathy:    She has no cervical adenopathy.  Neurological: She is alert and oriented to person, place, and time. She has normal reflexes. No cranial nerve deficit.  Skin: Skin is warm and dry. No rash noted. She is not diaphoretic. No erythema. No pallor.     LABORATORY DATA: I have personally reviewed the  data as listed: No visits with results within 1 Week(s) from this visit.  Latest known visit with results is:  Appointment on 11/20/2016  Component Date Value Ref Range Status  . WBC 11/20/2016 5.0  3.9 - 10.3 10e3/uL Final  . NEUT# 11/20/2016 3.4  1.5 - 6.5 10e3/uL Final  . HGB 11/20/2016 13.7  11.6 - 15.9 g/dL Final  . HCT 11/20/2016 41.1  34.8 - 46.6 % Final  .  Platelets 11/20/2016 284  145 - 400 10e3/uL Final  . MCV 11/20/2016 97.4  79.5 - 101.0 fL Final  . MCH 11/20/2016 32.5  25.1 - 34.0 pg Final  . MCHC 11/20/2016 33.4  31.5 - 36.0 g/dL Final  . RBC 11/20/2016 4.22  3.70 - 5.45 10e6/uL Final  . RDW 11/20/2016 13.1  11.2 - 14.5 % Final  . lymph# 11/20/2016 1.2  0.9 - 3.3 10e3/uL Final  . MONO# 11/20/2016 0.4  0.1 - 0.9 10e3/uL Final  . Eosinophils Absolute 11/20/2016 0.1  0.0 - 0.5 10e3/uL Final  . Basophils Absolute 11/20/2016 0.0  0.0 - 0.1 10e3/uL Final  . NEUT% 11/20/2016 68.1  38.4 - 76.8 % Final  . LYMPH% 11/20/2016 23.3  14.0 - 49.7 % Final  . MONO% 11/20/2016 7.0  0.0 - 14.0 % Final  . EOS% 11/20/2016 1.0  0.0 - 7.0 % Final  . BASO% 11/20/2016 0.6  0.0 - 2.0 % Final  . Sodium 11/20/2016 134* 136 - 145 mEq/L Final  . Potassium 11/20/2016 3.9  3.5 - 5.1 mEq/L Final  . Chloride 11/20/2016 99  98 - 109 mEq/L Final  . CO2 11/20/2016 27  22 - 29 mEq/L Final  . Glucose 11/20/2016 97  70 - 140 mg/dl Final   Glucose reference range is for nonfasting patients. Fasting glucose reference range is 70- 100.  Marland Kitchen BUN 11/20/2016 15.3  7.0 - 26.0 mg/dL Final  . Creatinine 11/20/2016 0.9  0.6 - 1.1 mg/dL Final  . Total Bilirubin 11/20/2016 0.57  0.20 - 1.20 mg/dL Final  . Alkaline Phosphatase 11/20/2016 78  40 - 150 U/L Final  . AST 11/20/2016 23  5 - 34 U/L Final  . ALT 11/20/2016 25  0 - 55 U/L Final  . Total Protein 11/20/2016 8.5* 6.4 - 8.3 g/dL Final  . Albumin 11/20/2016 4.6  3.5 - 5.0 g/dL Final  . Calcium 11/20/2016 9.9  8.4 - 10.4 mg/dL Final  . Anion Gap 11/20/2016 9  3 - 11 mEq/L Final  . EGFR 11/20/2016 71* >90 ml/min/1.73 m2 Final   eGFR is calculated using the CKD-EPI Creatinine Equation (2009)  . LDH 11/20/2016 171  125 - 245 U/L Final  . Uric Acid, Serum 11/20/2016 4.4  2.6 - 7.4 mg/dl Final  . Beta-2 11/20/2016 1.7  0.6 - 2.4 mg/L Final   Siemens Immulite 2000 Immunochemiluminometric assay (ICMA)  . IgG, Qn, Serum 11/20/2016  1,840* 700 - 1600 mg/dL Final  . IgA, Qn, Serum 11/20/2016 203  87 - 352 mg/dL Final  . IgM, Qn, Serum 11/20/2016 86  26 - 217 mg/dL Final  . Total Protein 11/20/2016 7.9  6.0 - 8.5 g/dL Final  . Albumin SerPl Elph-Mcnc 11/20/2016 4.1  2.9 - 4.4 g/dL Final  . Alpha 1 11/20/2016 0.3  0.0 - 0.4 g/dL Final  . Alpha2 Glob SerPl Elph-Mcnc 11/20/2016 0.8  0.4 - 1.0 g/dL Final  . B-Globulin SerPl Elph-Mcnc 11/20/2016 1.1  0.7 - 1.3 g/dL Final  . Gamma Glob SerPl Elph-Mcnc 11/20/2016 1.7  0.4 - 1.8 g/dL Final  . M Protein SerPl Elph-Mcnc 11/20/2016 1.1* Not Observed g/dL Final  . Globulin, Total 11/20/2016 3.8  2.2 - 3.9 g/dL Final  . Albumin/Glob SerPl 11/20/2016 1.1  0.7 - 1.7 Final  . IFE 1 11/20/2016 Comment   Final   Comment: Immunofixation shows IgG monoclonal protein with kappa light chain specificity.   . Please Note 11/20/2016 Comment   Final   Comment: Protein electrophoresis scan will follow via computer, mail, or courier delivery.   Loletha Grayer Kappa Free Light Chain 11/20/2016 15.9  3.3 - 19.4 mg/L Final  . Ig Lambda Free Light Chain 11/20/2016 11.9  5.7 - 26.3 mg/L Final  . Kappa/Lambda FluidC Ratio 11/20/2016 1.34  0.26 - 1.65 Final  . PROTEIN,TOTAL,URINE 11/23/2016 6.1  Not Estab. mg/dL Final   Total Volume: 6000 mL  . Prot,24hr calculated 11/23/2016 366* 30 - 150 mg/24 hr Final  . ALBUMIN, U 11/23/2016 0.0  % Final   Comment: No urine protein electrophoretic pattern was observed after concentration. However, there is a detectable amount of protein by a quantitative spectrophotometric method.   . ALPHA 1 URINE 11/23/2016 0.0  % Final  . ALPHA-2-GLOBULIN, U 11/23/2016 0.0  % Final  . % BETA, Urine 11/23/2016 0.0  % Final  . GAMMA GLOBULIN URINE 11/23/2016 0.0  % Final  . M-SPIKE, % 11/23/2016 Not Observed  Not Observed % Final  . Immunofixation Result, Urine 11/23/2016 Comment   Final   An apparent normal immunofixation pattern.  Marland Kitchen NOTE: 11/23/2016 Comment   Final   Comment:  Protein electrophoresis scan will follow via computer, mail, or courier delivery.   . Free Kappa Lt Chains,Ur 11/23/2016 1.66  1.35 - 24.19 mg/L Final   **Results verified by repeat testing**  . Free Lambda Lt Chains,Ur 11/23/2016 <0.05* 0.24 - 6.66 mg/L Final   **Results verified by repeat testing**  . Kappa/Lambda Ratio,U 11/23/2016 >33.20* 2.04 - 10.37 Final       Ardath Sax, MD

## 2016-12-06 NOTE — Telephone Encounter (Signed)
Spoke with pt this morning, and she had not yet received a phone call from [facility] regarding second opinion appointment. Dentist exam has been scheduled, so no need for further help with that. Spoke with Amy, Network engineer at [employer] [phone] and pt on the list to be called today. Left VM with pt sharing this information.

## 2016-12-07 ENCOUNTER — Telehealth: Payer: Self-pay

## 2016-12-07 NOTE — Telephone Encounter (Signed)
Fax sent to Amy at [facility] Hematology/Oncology containing [place] results and last three OV notes from 10/9, 10/16, and 10/22. Pt scheduled end of November with Dr. Christel Mormon. Sent message to Dr. Lebron Conners and Delle Reining, RN in the event that appointments or plans need to change. Pt is anxious and called Dr. Virgie Dad nurse Val to discuss concerns.   Val explained that due to her abnormal lab work, her doctor at [facility] referred her to Dr. Jana Hakim, but he was on vacation at the time, and to not delay her treatment, she was placed with Dr. Lebron Conners. Pt asked, "Should I see Dr. Jana Hakim too? I just feel overwhelmed." Val explained that she didn't need to see Dr. Doris Cheadle in addition. Plan for treatment would be the same. Her primary need at this time would be to go to [facility] . Pt said, "I have not yet heard from anyone at [facility] spoke with Amy at Dr. Kendell Bane office at [facility], and she told her that she had not called her yet. Dr. Alvie Heidelberg will not be available until January of 2019, but Dr. Christel Mormon can see her late November.   This RN spoke with Amy at [facility] on the phone in the afternoon. Pt had been called and appt scheduled with Dr. Christel Mormon. Pt had told Amy that she asked for the lenalidomide to not be sent at this time. She said, "I am just overwhelmed and wanted to not start for a couple of weeks."  This RN called and left VM with pt that message sent to Dr. Lebron Conners with all updates. Pt visit with him on 10/29. Time then to discuss any concerns and desired changes with therapy. Hopefully, pt able to relax and do something fun this weekend until visit with Dr. Lebron Conners on Monday. Nothing else to schedule or change at this time.

## 2016-12-07 NOTE — Telephone Encounter (Signed)
"  I still have not received a call from [facility] in reference to referral appointment with Dr. Samule Ohm.  Please call me,  [phone]."

## 2016-12-10 ENCOUNTER — Encounter: Payer: Self-pay | Admitting: Hematology and Oncology

## 2016-12-10 ENCOUNTER — Ambulatory Visit (HOSPITAL_BASED_OUTPATIENT_CLINIC_OR_DEPARTMENT_OTHER): Payer: BLUE CROSS/BLUE SHIELD | Admitting: Hematology and Oncology

## 2016-12-10 VITALS — BP 114/73 | HR 65 | Temp 98.6°F | Resp 18 | Ht 67.0 in | Wt 147.2 lb

## 2016-12-10 DIAGNOSIS — C9000 Multiple myeloma not having achieved remission: Secondary | ICD-10-CM

## 2016-12-12 ENCOUNTER — Telehealth: Payer: Self-pay | Admitting: Hematology and Oncology

## 2016-12-12 ENCOUNTER — Encounter: Payer: Self-pay | Admitting: Hematology and Oncology

## 2016-12-12 NOTE — Telephone Encounter (Signed)
appt for lab and f/u already schedueld per 10/29 - added infusion per los -

## 2016-12-12 NOTE — Telephone Encounter (Signed)
Spoke with patient regarding appt added per 10/31 sch msg.

## 2016-12-17 ENCOUNTER — Other Ambulatory Visit: Payer: Self-pay | Admitting: *Deleted

## 2016-12-17 ENCOUNTER — Encounter (INDEPENDENT_AMBULATORY_CARE_PROVIDER_SITE_OTHER): Payer: Self-pay

## 2016-12-17 MED ORDER — LEVETIRACETAM 500 MG PO TABS: 500.0000 mg | ORAL_TABLET | Freq: Two times a day (BID) | ORAL | 3 refills | Status: DC

## 2016-12-17 MED ORDER — ALPRAZOLAM 0.5 MG PO TABS: ORAL_TABLET | ORAL | 0 refills | Status: DC

## 2016-12-17 NOTE — Telephone Encounter (Signed)
Pt has questions reg EEG results. She is going to start chemo the 1st of December and is wanting to discuss her questions prior to starting treatment. She can be reached at [phone]

## 2016-12-17 NOTE — Telephone Encounter (Signed)
"  I need to speak with the nurse who sent a patient message.  No thanks.  I only want that nurse.  I'll send a message through the portal."  Denies further questions or needs.

## 2016-12-17 NOTE — Telephone Encounter (Signed)
I called the patient.  The patient has never started the [place], she has not got an MRI of the brain.  Shortly after the EEG study, she was found to have multiple myeloma, she is getting prepared for chemotherapy.  Keppra should be completely compatible with chemotherapy.  We will get the MRI of the brain done.  Another prescription for Keppra was sent in.

## 2016-12-17 NOTE — Addendum Note (Signed)
Addended by: Kathrynn Ducking on: 12/17/2016 05:22 PM   Modules accepted: Orders

## 2016-12-18 ENCOUNTER — Telehealth: Payer: Self-pay | Admitting: *Deleted

## 2016-12-18 ENCOUNTER — Ambulatory Visit: Payer: BLUE CROSS/BLUE SHIELD | Admitting: Hematology and Oncology

## 2016-12-18 ENCOUNTER — Other Ambulatory Visit: Payer: BLUE CROSS/BLUE SHIELD

## 2016-12-18 ENCOUNTER — Ambulatory Visit: Payer: BLUE CROSS/BLUE SHIELD

## 2016-12-18 NOTE — Telephone Encounter (Signed)
Faxed printed/signed rx xanax to [place] at [phone]. Received fax confirmation.

## 2016-12-18 NOTE — Progress Notes (Signed)
[facility] Follow-up Visit:  Assessment: Multiple myeloma not having achieved remission Victoria [facility]) 60 y.o. female with initial presentation of monoclonal gammopathy of unknown significance in the context of compression fracture of T4 vertebral body, returning after completing initial workup possible multiple myeloma.  Workup confirms presence of active multiple myeloma based on presence of monoclonal gammopathy, scattered skeletal lesions and presence of at least 10% replacement of the bone marrow cellularity with monotypic plasma cell population.  Cytogenetics, FISH, and molecular studies are pending at this time.  Based on the age and overall performance status of the patient, as well as the appearance of her disease, I believe she is an swelling candidate for aggressive treatment including induction therapy with a triple regimen combining lenalidomide, bortezomib, and dexamethasone followed by possible consolidation by high-dose chemotherapy with autologous stem cell rescue.  Patient is understandably apprehensive about initiating treatment.  At this time, she would like to defer initiation of therapy in favor of obtaining a second opinion from [facility].  Plan: --Consult orthopedic surgery regarding bilateral femoral lesions -- if these would require internal fixation for prophylaxis or fracture, surgery needs to proceed to initiation of systemic anticancer therapy or initiation of zoledronic acid. --Second opinion at [facility] on patient's request and with my full support --RTC to our clinic 1 week after evaluation by [facility] for possible initiation of bortezomib, lenalidomide, dexamethasone, and zoledronic acid.  Voice recognition software was used and creation of this note. Despite my best effort at editing the text, some misspelling/errors may have occurred.   Orders Placed This Encounter  Procedures  . CBC with Differential    Standing Status:   Future     Standing Expiration Date:   12/10/2017  . Comprehensive metabolic panel    Standing Status:   Future    Standing Expiration Date:   12/10/2017  . Lactate dehydrogenase (LDH)    Standing Status:   Future    Standing Expiration Date:   12/10/2017  . Beta 2 microglobulin, serum    Standing Status:   Future    Standing Expiration Date:   12/10/2017  . Multiple Myeloma Panel (SPEP&IFE w/QIG)    Standing Status:   Future    Standing Expiration Date:   12/10/2017  . Kappa/lambda light chains    Standing Status:   Future    Standing Expiration Date:   12/10/2017    Cancer Staging Multiple myeloma not having achieved remission (Victoria Stokes) Staging form: Plasma Cell Myeloma and Plasma Cell Disorders, AJCC 8th Edition - Clinical: Beta-2-microglobulin (mg/L): 1.7, Albumin (g/dL): 4.6, ISS: Stage I, LDH: Normal - Unsigned   All questions were answered. . The patient knows to call the clinic with any problems, questions or concerns.  This note was electronically signed.    History of Presenting Illness SUSETTE Stokes is a 61 y.o. female followed in the [facility] for Diagnosis of multiple myeloma. Patient has history of infrequent seizure events with the last one occurring on Father's Day in 2018 following a period of outdoors activity with dehydration. Previous events occurred in 1987 and 1985 and similar circumstances and were all associated with significant hyponatremia. In the interim, patient has been seizure-free without any antiseizure medications. Following the latest seizure event, patient has developed significant discomfort in the mid back. The area had discomfort in the past, but severity has increased. Imaging was obtained demonstrating a compression fracture at T4 with unknown acuity. At this time, pain and discomfort have resolved.  Patient denies weakness or paresthesias in the lower extremities. Denies any paresthesia or her bladder or bowel. Patient also indicates Raynaud's phenomenon  in bilateral hands, some thinning of her hair, but denies any oral sores. She does have chronic sinus pressure with sinus drainage and sore throat. No significant febrile illness, and no epistaxis or purulent nasal drainage.  Due to a discovery on a compression fracture, additional lab work was obtained demonstrating presence of monoclonal gammopathy leading to referral to our service. Please see the lab work below for details. Subsequently, additional assessment including repeat lab work and additional imaging were obtained. These demonstrate multiple scattered lytic lesions as described below.  Shunt returns to the clinic to discuss results of the recently obtained bone marrow biopsy.  She denies any new complaints since the last visit to the clinic.  Oncological/hematological History: --Labs, 01/22/12:                                                                                                            WBC 3.0, ANC 1.3, ALC 1.2, Mono 0.3, Eos 0.2, Baso 0.0,  Hgb 12.8, Plt 228; --Labs, 08/01/16: tProt   ..., Alb   ..., Ca 9.7, Cr 0.8, AP  .Marland Kitchen.;                                           WBC 5.0, Nl diff,                                                                     Hgb 13.6, Plt 307    Multiple myeloma not having achieved remission ([facility])   11/20/2016 Tumor Marker    tProt 8.5, Alb 4.6, Ca 9.9, Cr 0.9, AP 78; LDH 171, beta-2 microglobulin 1.7 SPEP -- M-spike 1.1g/dL, SIFE -- IgG kappa IgG 1840, IgA 203, IgM 86; kappa 15.9, lambda 11.9, KLR 1.34 WBC 5.0, Hgb 13.7, Plt 284       11/26/2016 Imaging    Skeletal Survey: Scattered lytic lesions noted in the skull, lateral right ischium, proximal right femur, in mid right tibia. Areas of periarticular osteoporosis noted.      11/27/2016 Initial Diagnosis    Multiple myeloma not having achieved remission ([place])      12/03/2016 Bone Marrow Biopsy    The bone marrow is generally normocellular for age with trilineage hematopoiesis and  essentially orderly and progressive maturation of all myeloid cell lines. In this background, the plasma cells are increased in number representing 10% of all cells with associated atypical cytomorphologic features. Immunohistochemical stains highlight the plasma cells in the marrow which show kappa light chain excess/restriction. In the presence of a monoclonal protein with kappa light chain specificity,  the findings are consistent with involvement by plasma cell neoplasm.         Medical History: Past Medical History:  Diagnosis Date  . Low sodium levels    Hx  . Seizures ([place]) 10/29/2016    Surgical History: Past Surgical History:  Procedure Laterality Date  . CESAREAN SECTION      Family History: Family History  Problem Relation Age of Onset  . Breast cancer Mother   . Pancreatic cancer Father   . Seizures Neg Hx     Social History: Social History   Socioeconomic History  . Marital status: Married    Spouse name: Collier Salina  . Number of children: 2  . Years of education: 12  . Highest education level: Not on file  Social Needs  . Financial resource strain: Not on file  . Food insecurity - worry: Not on file  . Food insecurity - inability: Not on file  . Transportation needs - medical: Not on file  . Transportation needs - non-medical: Not on file  Occupational History  . Occupation: Chief Executive Officer  Tobacco Use  . Smoking status: Never Smoker  . Smokeless tobacco: Never Used  Substance and Sexual Activity  . Alcohol use: Yes    Alcohol/week: 0.0 oz    Comment: 2 drinks per month  . Drug use: No  . Sexual activity: Not on file  Other Topics Concern  . Not on file  Social History Narrative   Lives   Caffeine use: 2 cups per day (coffee)   Right handed     Allergies: No Known Allergies  Medications:  Current Outpatient Medications  Medication Sig Dispense Refill  . B Complex Vitamins (VITAMIN B-COMPLEX PO) Take by mouth.    Marland Kitchen CALCIUM PO Take by mouth.    .  Cholecalciferol (VITAMIN D PO) Take by mouth.    . CHOLINE PO Take by mouth.    Marland Kitchen VITAMIN A PO Take by mouth.    Marland Kitchen VITAMIN K PO Take by mouth.    . zolpidem (AMBIEN) 5 MG tablet Take 5 mg by mouth as needed for sleep.    Marland Kitchen ALPRAZolam (XANAX) 0.5 MG tablet Take 2 tablets approximately 45 minutes prior to the MRI study, take a third tablet if needed. 3 tablet 0  . lenalidomide (REVLIMID) 25 MG capsule Take 1 capsule by mouth once daily on days 1-14 every 21 days. Auth# [id number] (Patient not taking: Reported on 12/10/2016) 14 capsule 0  . levETIRAcetam (KEPPRA) 500 MG tablet Take 1 tablet (500 mg total) 2 (two) times daily by mouth. 60 tablet 3  . LORazepam (ATIVAN) 0.5 MG tablet Take 1 tablet (0.5 mg total) by mouth every 4 (four) hours as needed for anxiety. (Patient not taking: Reported on 12/10/2016) 2 tablet 0   No current facility-administered medications for this visit.     Review of Systems: Review of Systems  Constitutional: Negative.   HENT:   Negative for mouth sores and sore throat.        Sinus pressure  Eyes: Negative.   Respiratory: Negative.   Cardiovascular: Negative.   Gastrointestinal: Negative.   Endocrine: Negative.   Genitourinary: Negative.    Musculoskeletal: Positive for back pain.       Raynaud's phenomenon  Skin: Negative for rash.       Hair thinning  Neurological: Positive for seizures.  Hematological: Negative.   Psychiatric/Behavioral: Negative.      PHYSICAL EXAMINATION Blood pressure 114/73, pulse 65, temperature 98.6 F (37  C), temperature source Oral, resp. rate 18, height _0  (1.702 m), weight 147 lb 3.2 oz (66.8 kg), SpO2 100 %.  ECOG PERFORMANCE STATUS: 0 - Asymptomatic  Physical Exam  Constitutional: She is oriented to person, place, and time and well-developed, well-nourished, and in no distress. No distress.  HENT:  Head: Normocephalic and atraumatic.  Mouth/Throat: Oropharynx is clear and moist. No oropharyngeal exudate.  Eyes:  Conjunctivae and EOM are normal. Pupils are equal, round, and reactive to light. No scleral icterus.  Neck: Normal range of motion. Neck supple. No thyromegaly present.  Cardiovascular: Normal rate, regular rhythm and normal heart sounds.  No murmur heard. Pulmonary/Chest: Effort normal and breath sounds normal. No respiratory distress. She has no wheezes. She has no rales.  Abdominal: Soft. She exhibits no distension and no mass. There is no tenderness. There is no rebound and no guarding.  Musculoskeletal: Normal range of motion. She exhibits no edema or tenderness.  Lymphadenopathy:    She has no cervical adenopathy.  Neurological: She is alert and oriented to person, place, and time. She has normal reflexes. No cranial nerve deficit.  Skin: Skin is warm and dry. No rash noted. She is not diaphoretic. No erythema. No pallor.     LABORATORY DATA: I have personally reviewed the data as listed: No visits with results within 1 Week(s) from this visit.  Latest known visit with results is:  Orders Only on 12/03/2016  Component Date Value Ref Range Status  . WBC 12/03/2016 4.4  3.9 - 10.3 10e3/uL Final  . NEUT# 12/03/2016 3.0  1.5 - 6.5 10e3/uL Final  . HGB 12/03/2016 13.7  11.6 - 15.9 g/dL Final  . HCT 12/03/2016 39.8  34.8 - 46.6 % Final  . Platelets 12/03/2016 273  145 - 400 10e3/uL Final  . MCV 12/03/2016 96.5  79.5 - 101.0 fL Final  . MCH 12/03/2016 33.2  25.1 - 34.0 pg Final  . MCHC 12/03/2016 34.4  31.5 - 36.0 g/dL Final  . RBC 12/03/2016 4.12  3.70 - 5.45 10e6/uL Final  . RDW 12/03/2016 12.9  11.2 - 14.5 % Final  . lymph# 12/03/2016 0.9  0.9 - 3.3 10e3/uL Final  . MONO# 12/03/2016 0.4  0.1 - 0.9 10e3/uL Final  . Eosinophils Absolute 12/03/2016 0.0  0.0 - 0.5 10e3/uL Final  . Basophils Absolute 12/03/2016 0.0  0.0 - 0.1 10e3/uL Final  . NEUT% 12/03/2016 69.6  38.4 - 76.8 % Final  . LYMPH% 12/03/2016 19.4  14.0 - 49.7 % Final  . MONO% 12/03/2016 9.9  0.0 - 14.0 % Final  . EOS%  12/03/2016 0.8  0.0 - 7.0 % Final  . BASO% 12/03/2016 0.3  0.0 - 2.0 % Final       Ardath Sax, MD

## 2016-12-18 NOTE — Assessment & Plan Note (Signed)
61 y.o. female with initial presentation of monoclonal gammopathy of unknown significance in the context of compression fracture of T4 vertebral body, returning after completing initial workup possible multiple myeloma.  Workup confirms presence of active multiple myeloma based on presence of monoclonal gammopathy, scattered skeletal lesions and presence of at least 10% replacement of the bone marrow cellularity with monotypic plasma cell population.  Cytogenetics, FISH, and molecular studies are pending at this time.  Based on the age and overall performance status of the patient, as well as the appearance of her disease, I believe she is an swelling candidate for aggressive treatment including induction therapy with a triple regimen combining lenalidomide, bortezomib, and dexamethasone followed by possible consolidation by high-dose chemotherapy with autologous stem cell rescue.  Patient is understandably apprehensive about initiating treatment.  At this time, she would like to defer initiation of therapy in favor of obtaining a second opinion from [facility].  Plan: --Consult orthopedic surgery regarding bilateral femoral lesions -- if these would require internal fixation for prophylaxis or fracture, surgery needs to proceed to initiation of systemic anticancer therapy or initiation of zoledronic acid. --Second opinion at [facility] on patient's request and with my full support --RTC to our clinic 1 week after evaluation by [facility] for possible initiation of bortezomib, lenalidomide, dexamethasone, and zoledronic acid.

## 2016-12-19 ENCOUNTER — Telehealth: Payer: Self-pay

## 2016-12-19 DIAGNOSIS — J330 Polyp of nasal cavity: Secondary | ICD-10-CM | POA: Diagnosis not present

## 2016-12-19 NOTE — Telephone Encounter (Signed)
-----   Message from Arty Baumgartner, RN sent at 12/19/2016  9:25 AM EST ----- Regarding: FW: Pt request   ----- Message ----- From: Ardath Sax, MD Sent: 12/18/2016   4:06 PM To: Arty Baumgartner, RN Subject: RE: Pt request                                 Note is done. Could you send it to the patient, please? Thank you ----- Message ----- From: Arty Baumgartner, RN Sent: 12/12/2016   3:46 PM To: Ardath Sax, MD Subject: Pt request                                     Dr. Lebron Conners,   Mrs. Camey is requesting a copy of her recent office visit notes.  Please let me know when this has been done/if you are ok with that, and I will have her fill out a release of medical information form.    Thanks,  [employer]

## 2016-12-19 NOTE — Telephone Encounter (Signed)
Desk RN contacted pt to let her know we put her requested visit summary notes and bone marrow biopsy in the mail for her.  She verbalized understanding to be looking for those in the mail.

## 2016-12-20 ENCOUNTER — Other Ambulatory Visit: Payer: Self-pay | Admitting: Otolaryngology

## 2016-12-20 DIAGNOSIS — J339 Nasal polyp, unspecified: Secondary | ICD-10-CM

## 2016-12-25 ENCOUNTER — Ambulatory Visit: Payer: BLUE CROSS/BLUE SHIELD

## 2016-12-25 ENCOUNTER — Encounter (HOSPITAL_COMMUNITY): Payer: Self-pay

## 2016-12-25 DIAGNOSIS — G40909 Epilepsy, unspecified, not intractable, without status epilepticus: Secondary | ICD-10-CM

## 2016-12-25 MED ORDER — GADOPENTETATE DIMEGLUMINE 469.01 MG/ML IV SOLN: 13.0000 mL | Freq: Once | INTRAVENOUS | Status: AC | PRN

## 2016-12-26 LAB — CHROMOSOME ANALYSIS, BONE MARROW

## 2016-12-26 LAB — TISSUE HYBRIDIZATION (BONE MARROW)-NCBH

## 2016-12-27 ENCOUNTER — Telehealth: Payer: Self-pay | Admitting: Hematology and Oncology

## 2016-12-27 ENCOUNTER — Telehealth: Payer: Self-pay

## 2016-12-27 ENCOUNTER — Other Ambulatory Visit: Payer: BLUE CROSS/BLUE SHIELD

## 2016-12-27 ENCOUNTER — Encounter: Payer: Self-pay | Admitting: Neurology

## 2016-12-27 ENCOUNTER — Telehealth: Payer: Self-pay | Admitting: Neurology

## 2016-12-27 NOTE — Telephone Encounter (Signed)
Spoke to patient regarding upcoming November appointments. Patient rescheduled per 11/8 sch message.

## 2016-12-27 NOTE — Telephone Encounter (Signed)
  I called the patient.  MRI of the brain shows minimal white matter changes, some lytic lesions associated with diagnosis of multiple myeloma.  She will remain on Keppra for now.  We will follow-up with her in the office.  MRI brain 12/27/16:  IMPRESSION:  Mildly abnormal MRI brain (with and without) demonstrating: 1. Few scattered subcortical foci of non-specific gliosis.  2. No acute findings. No abnormal lesions are seen on post contrast views.   3. Several small foci of lytic lesions in the calvarium may be related to multiple myeloma, and likely stable from prior CT and bone survey.

## 2016-12-27 NOTE — Telephone Encounter (Signed)
Patient called in reference to upcoming appointment at [facility] on 01/10/17. Patient requesting actual films of all imaging test done. Patient given the number to Radiology Film Room- [phone]. Patient also requested that most recent cbc with diff and cytology be faxed to Amy at [facility]. Faxed to [phone]. Patient aware to call back with any concerns or questions. Confirmation of fax received page printed.

## 2016-12-28 DIAGNOSIS — C9000 Multiple myeloma not having achieved remission: Secondary | ICD-10-CM | POA: Diagnosis not present

## 2016-12-28 DIAGNOSIS — D4989 Neoplasm of unspecified behavior of other specified sites: Secondary | ICD-10-CM | POA: Diagnosis not present

## 2017-01-01 ENCOUNTER — Encounter (INDEPENDENT_AMBULATORY_CARE_PROVIDER_SITE_OTHER): Payer: Self-pay

## 2017-01-01 ENCOUNTER — Telehealth: Payer: Self-pay

## 2017-01-01 NOTE — Telephone Encounter (Signed)
Patient called to ask to schedule an appointment with Dr. Lebron Conners to discuss some lab results.  A scheduling message was sent to ask them to call patient to schedule this appointment.

## 2017-01-02 ENCOUNTER — Ambulatory Visit (HOSPITAL_BASED_OUTPATIENT_CLINIC_OR_DEPARTMENT_OTHER): Payer: Self-pay | Admitting: Hematology and Oncology

## 2017-01-02 ENCOUNTER — Telehealth: Payer: Self-pay

## 2017-01-02 VITALS — BP 145/81 | HR 71 | Temp 98.2°F | Resp 18 | Ht 67.0 in | Wt 144.4 lb

## 2017-01-02 DIAGNOSIS — C9000 Multiple myeloma not having achieved remission: Secondary | ICD-10-CM

## 2017-01-02 NOTE — Telephone Encounter (Signed)
error 

## 2017-01-02 NOTE — Telephone Encounter (Signed)
Patient wanted appointment date for upcoming visit.per 11/21 los

## 2017-01-07 ENCOUNTER — Telehealth: Payer: Self-pay | Admitting: Hematology and Oncology

## 2017-01-07 DIAGNOSIS — Z23 Encounter for immunization: Secondary | ICD-10-CM | POA: Diagnosis not present

## 2017-01-07 NOTE — Telephone Encounter (Signed)
Left message for patient regarding upcoming appointment updates 11/29.

## 2017-01-08 DIAGNOSIS — M899 Disorder of bone, unspecified: Secondary | ICD-10-CM | POA: Diagnosis not present

## 2017-01-08 DIAGNOSIS — C9000 Multiple myeloma not having achieved remission: Secondary | ICD-10-CM | POA: Diagnosis not present

## 2017-01-09 ENCOUNTER — Encounter: Payer: Self-pay | Admitting: *Deleted

## 2017-01-10 ENCOUNTER — Telehealth: Payer: Self-pay | Admitting: Hematology and Oncology

## 2017-01-10 ENCOUNTER — Ambulatory Visit (HOSPITAL_BASED_OUTPATIENT_CLINIC_OR_DEPARTMENT_OTHER): Payer: BLUE CROSS/BLUE SHIELD | Admitting: Hematology and Oncology

## 2017-01-10 ENCOUNTER — Other Ambulatory Visit: Payer: BLUE CROSS/BLUE SHIELD

## 2017-01-10 ENCOUNTER — Encounter: Payer: Self-pay | Admitting: Hematology and Oncology

## 2017-01-10 VITALS — BP 133/77 | HR 72 | Temp 98.4°F | Resp 20 | Ht 67.0 in | Wt 145.1 lb

## 2017-01-10 DIAGNOSIS — C9000 Multiple myeloma not having achieved remission: Secondary | ICD-10-CM | POA: Diagnosis not present

## 2017-01-10 DIAGNOSIS — M549 Dorsalgia, unspecified: Secondary | ICD-10-CM | POA: Diagnosis not present

## 2017-01-10 DIAGNOSIS — R569 Unspecified convulsions: Secondary | ICD-10-CM

## 2017-01-10 NOTE — Telephone Encounter (Signed)
Gave avs and calendar for January 2019 °

## 2017-01-14 ENCOUNTER — Other Ambulatory Visit: Payer: BLUE CROSS/BLUE SHIELD

## 2017-01-14 ENCOUNTER — Ambulatory Visit: Payer: BLUE CROSS/BLUE SHIELD | Admitting: Hematology and Oncology

## 2017-01-14 ENCOUNTER — Ambulatory Visit: Payer: BLUE CROSS/BLUE SHIELD

## 2017-01-19 ENCOUNTER — Encounter: Payer: Self-pay | Admitting: Hematology and Oncology

## 2017-01-19 NOTE — Progress Notes (Signed)
[facility] Follow-up Visit:  Assessment: Multiple myeloma not having achieved remission [facility] 61 y.o. female with initial presentation of monoclonal gammopathy of unknown significance in the context of compression fracture of T4 vertebral body, returning after completing initial workup possible multiple myeloma.  Workup confirms presence of active multiple myeloma based on presence of monoclonal gammopathy, scattered skeletal lesions and presence of at least 10% replacement of the bone marrow cellularity with monotypic plasma cell population.  Cytogenetics, FISH, and molecular studies are pending at this time.  Based on the age and overall performance status of the patient, as well as the appearance of her disease, I believe she is an swelling candidate for aggressive treatment including induction therapy with a triple regimen combining lenalidomide, bortezomib, and dexamethasone followed by possible consolidation by high-dose chemotherapy with autologous stem cell rescue.  Patient is understandably apprehensive about initiating treatment.  At this time, she would like to defer initiation of therapy in favor of obtaining a second opinion from [facility].  Plan: --Consult orthopedic surgery regarding bilateral femoral lesions -- if these would require internal fixation for prophylaxis or fracture, surgery needs to proceed to initiation of systemic anticancer therapy or initiation of zoledronic acid. --Second opinion at [facility] on patient's request and with my full support --RTC to our clinic 1 week after evaluation by [facility] for possible initiation of bortezomib, lenalidomide, dexamethasone, and zoledronic acid.  Voice recognition software was used and creation of this note. Despite my best effort at editing the text, some misspelling/errors may have occurred.   No orders of the defined types were placed in this encounter.   Cancer Staging Multiple  myeloma not having achieved remission ([place]) Staging form: Plasma Cell Myeloma and Plasma Cell Disorders, AJCC 8th Edition - Clinical: Beta-2-microglobulin (mg/L): 1.7, Albumin (g/dL): 4.6, ISS: Stage I, LDH: Normal - Unsigned   All questions were answered. . The patient knows to call the clinic with any problems, questions or concerns.  This note was electronically signed.    History of Presenting Illness Victoria Stokes is a 61 y.o. female followed in the [place] for Diagnosis of multiple myeloma. Patient has history of infrequent seizure events with the last one occurring on Father's Day in 2018 following a period of outdoors activity with dehydration. Previous events occurred in 1987 and 1985 and similar circumstances and were all associated with significant hyponatremia. In the interim, patient has been seizure-free without any antiseizure medications. Following the latest seizure event, patient has developed significant discomfort in the mid back. The area had discomfort in the past, but severity has increased. Imaging was obtained demonstrating a compression fracture at T4 with unknown acuity. At this time, pain and discomfort have resolved. Patient denies weakness or paresthesias in the lower extremities. Denies any paresthesia or her bladder or bowel. Patient also indicates Raynaud's phenomenon in bilateral hands, some thinning of her hair, but denies any oral sores. She does have chronic sinus pressure with sinus drainage and sore throat. No significant febrile illness, and no epistaxis or purulent nasal drainage.  Due to a discovery on a compression fracture, additional lab work was obtained demonstrating presence of monoclonal gammopathy leading to referral to our service. Please see the lab work below for details. Subsequently, additional assessment including repeat lab work and additional imaging were obtained. These demonstrate multiple scattered lytic lesions as described  below.  Patient returns to the clinic to review of the previous findings and discuss the options again in  anticipation of seeing [place] [facility].  Patient denies any new complaints since last visit to the clinic.  Oncological/hematological History: --Labs, 01/22/12:                                                                                                            WBC 3.0, ANC 1.3, ALC 1.2, Mono 0.3, Eos 0.2, Baso 0.0,  Hgb 12.8, Plt 228; --Labs, 08/01/16: tProt   ..., Alb   ..., Ca 9.7, Cr 0.8, AP  .Marland Kitchen.;                                           WBC 5.0, Nl diff,                                                                     Hgb 13.6, Plt 307    Multiple myeloma not having achieved remission ([facility])   11/20/2016 Tumor Marker    tProt 8.5, Alb 4.6, Ca 9.9, Cr 0.9, AP 78; LDH 171, beta-2 microglobulin 1.7 SPEP -- M-spike 1.1g/dL, SIFE -- IgG kappa IgG 1840, IgA 203, IgM 86; kappa 15.9, lambda 11.9, KLR 1.34 WBC 5.0, Hgb 13.7, Plt 284       11/26/2016 Imaging    Skeletal Survey: Scattered lytic lesions noted in the skull, lateral right ischium, proximal right femur, in mid right tibia. Areas of periarticular osteoporosis noted.      11/27/2016 Initial Diagnosis    Multiple myeloma not having achieved remission ([place])      12/03/2016 Bone Marrow Biopsy    The bone marrow is generally normocellular for age with trilineage hematopoiesis and essentially orderly and progressive maturation of all myeloid cell lines. In this background, the plasma cells are increased in number representing 10% of all cells with associated atypical cytomorphologic features. Immunohistochemical stains highlight the plasma cells in the marrow which show kappa light chain excess/restriction. In the presence of a monoclonal protein with kappa light chain specificity, the findings are consistent with involvement by plasma cell neoplasm.         Medical History: Past Medical History:  Diagnosis Date  . Low  sodium levels    Hx  . Seizures ([place]) 10/29/2016    Surgical History: Past Surgical History:  Procedure Laterality Date  . CESAREAN SECTION      Family History: Family History  Problem Relation Age of Onset  . Breast cancer Mother   . Pancreatic cancer Father   . Seizures Neg Hx     Social History: Social History   Socioeconomic History  . Marital status: Married    Spouse name: Collier Salina  . Number of children: 2  . Years of education: 63  .  Highest education level: Not on file  Social Needs  . Financial resource strain: Not on file  . Food insecurity - worry: Not on file  . Food insecurity - inability: Not on file  . Transportation needs - medical: Not on file  . Transportation needs - non-medical: Not on file  Occupational History  . Occupation: Chief Executive Officer  Tobacco Use  . Smoking status: Never Smoker  . Smokeless tobacco: Never Used  Substance and Sexual Activity  . Alcohol use: Yes    Alcohol/week: 0.0 oz    Comment: 2 drinks per month  . Drug use: No  . Sexual activity: Not on file  Other Topics Concern  . Not on file  Social History Narrative   Lives   Caffeine use: 2 cups per day (coffee)   Right handed     Allergies: No Known Allergies  Medications:  Current Outpatient Medications  Medication Sig Dispense Refill  . ALPRAZolam (XANAX) 0.5 MG tablet Take 2 tablets approximately 45 minutes prior to the MRI study, take a third tablet if needed. 3 tablet 0  . B Complex Vitamins (VITAMIN B-COMPLEX PO) Take by mouth.    . Cholecalciferol (VITAMIN D PO) Take by mouth.    . CHOLINE PO Take by mouth.    Marland Kitchen LORazepam (ATIVAN) 0.5 MG tablet Take 1 tablet (0.5 mg total) by mouth every 4 (four) hours as needed for anxiety. (Patient not taking: Reported on 12/10/2016) 2 tablet 0  . VITAMIN A PO Take by mouth.    Marland Kitchen VITAMIN K PO Take by mouth.    . zolpidem (AMBIEN) 5 MG tablet Take 5 mg by mouth as needed for sleep.     No current facility-administered medications for  this visit.    Facility-Administered Medications Ordered in Other Visits  Medication Dose Route Frequency Provider Last Rate Last Dose  . gadopentetate dimeglumine (MAGNEVIST) injection 13 mL  13 mL Intravenous Once PRN Kathrynn Ducking, MD        Review of Systems: Review of Systems  Constitutional: Negative.   HENT:   Negative for mouth sores and sore throat.        Sinus pressure  Eyes: Negative.   Respiratory: Negative.   Cardiovascular: Negative.   Gastrointestinal: Negative.   Endocrine: Negative.   Genitourinary: Negative.    Musculoskeletal: Positive for back pain.       Raynaud's phenomenon  Skin: Negative for rash.       Hair thinning  Neurological: Positive for seizures.  Hematological: Negative.   Psychiatric/Behavioral: Negative.      PHYSICAL EXAMINATION Blood pressure (!) 145/81, pulse 71, temperature 98.2 F (36.8 C), temperature source Oral, resp. rate 18, height _0  (1.702 m), weight 144 lb 6.4 oz (65.5 kg), SpO2 100 %.  ECOG PERFORMANCE STATUS: 0 - Asymptomatic  Physical Exam  Constitutional: She is oriented to person, place, and time and well-developed, well-nourished, and in no distress. No distress.  HENT:  Head: Normocephalic and atraumatic.  Mouth/Throat: Oropharynx is clear and moist. No oropharyngeal exudate.  Eyes: Conjunctivae and EOM are normal. Pupils are equal, round, and reactive to light. No scleral icterus.  Neck: Normal range of motion. Neck supple. No thyromegaly present.  Cardiovascular: Normal rate, regular rhythm and normal heart sounds.  No murmur heard. Pulmonary/Chest: Effort normal and breath sounds normal. No respiratory distress. She has no wheezes. She has no rales.  Abdominal: Soft. She exhibits no distension and no mass. There is no tenderness. There is no  rebound and no guarding.  Musculoskeletal: Normal range of motion. She exhibits no edema or tenderness.  Lymphadenopathy:    She has no cervical adenopathy.   Neurological: She is alert and oriented to person, place, and time. She has normal reflexes. No cranial nerve deficit.  Skin: Skin is warm and dry. No rash noted. She is not diaphoretic. No erythema. No pallor.     LABORATORY DATA: I have personally reviewed the data as listed: No visits with results within 1 Week(s) from this visit.  Latest known visit with results is:  Hospital Outpatient Visit on 12/03/2016  Component Date Value Ref Range Status  . Chromosome-Routine 12/03/2016 SEE SEPARATE REPORT   Final   Performed at [facility]  . Tissue hybridization (bone marrow)* 12/03/2016 SEE SEPARATE REPORT   Final   Performed at [facility], [place]

## 2017-01-19 NOTE — Assessment & Plan Note (Signed)
61 y.o. female with initial presentation of monoclonal gammopathy of unknown significance in the context of compression fracture of T4 vertebral body, returning after completing initial workup possible multiple myeloma.  Workup confirms presence of active multiple myeloma based on presence of monoclonal gammopathy, scattered skeletal lesions and presence of at least 10% replacement of the bone marrow cellularity with monotypic plasma cell population.  Cytogenetics, FISH, and molecular studies are pending at this time.  Based on the age and overall performance status of the patient, as well as the appearance of her disease, I believe she is an swelling candidate for aggressive treatment including induction therapy with a triple regimen combining lenalidomide, bortezomib, and dexamethasone followed by possible consolidation by high-dose chemotherapy with autologous stem cell rescue.  Patient is understandably apprehensive about initiating treatment.  At this time, she would like to defer initiation of therapy in favor of obtaining a second opinion from [facility].  Plan: --Consult orthopedic surgery regarding bilateral femoral lesions -- if these would require internal fixation for prophylaxis or fracture, surgery needs to proceed to initiation of systemic anticancer therapy or initiation of zoledronic acid. --Second opinion at [facility] on patient's request and with my full support --RTC to our clinic 1 week after evaluation by [facility] for possible initiation of bortezomib, lenalidomide, dexamethasone, and zoledronic acid. 

## 2017-01-22 DIAGNOSIS — C9000 Multiple myeloma not having achieved remission: Secondary | ICD-10-CM | POA: Diagnosis not present

## 2017-01-22 DIAGNOSIS — M899 Disorder of bone, unspecified: Secondary | ICD-10-CM | POA: Diagnosis not present

## 2017-01-22 DIAGNOSIS — M4854XA Collapsed vertebra, not elsewhere classified, thoracic region, initial encounter for fracture: Secondary | ICD-10-CM | POA: Diagnosis not present

## 2017-01-25 DIAGNOSIS — Z01818 Encounter for other preprocedural examination: Secondary | ICD-10-CM | POA: Diagnosis not present

## 2017-01-25 DIAGNOSIS — Z1211 Encounter for screening for malignant neoplasm of colon: Secondary | ICD-10-CM | POA: Diagnosis not present

## 2017-01-28 DIAGNOSIS — E8809 Other disorders of plasma-protein metabolism, not elsewhere classified: Secondary | ICD-10-CM | POA: Diagnosis not present

## 2017-01-28 DIAGNOSIS — C9000 Multiple myeloma not having achieved remission: Secondary | ICD-10-CM | POA: Diagnosis not present

## 2017-02-11 DIAGNOSIS — Z803 Family history of malignant neoplasm of breast: Secondary | ICD-10-CM | POA: Diagnosis not present

## 2017-02-11 DIAGNOSIS — Z1231 Encounter for screening mammogram for malignant neoplasm of breast: Secondary | ICD-10-CM | POA: Diagnosis not present

## 2017-02-14 ENCOUNTER — Telehealth: Payer: Self-pay | Admitting: Hematology and Oncology

## 2017-02-14 NOTE — Telephone Encounter (Signed)
Patient called in to cancel she is going to [facility]

## 2017-02-14 NOTE — Assessment & Plan Note (Signed)
62 y.o. female with initial presentation of monoclonal gammopathy of unknown significance in the context of compression fracture of T4 vertebral body, returning after completing initial workup possible multiple myeloma.  Workup confirms presence of active multiple myeloma based on presence of monoclonal gammopathy, scattered skeletal lesions and presence of at least 10% replacement of the bone marrow cellularity with monotypic plasma cell population.  Cytogenetics, FISH, and molecular studies are pending at this time.  Patient has visited for second opinion at [facility] and has been evaluated by Dr. Shirlean Mylar.  After careful evaluation of the patient's history, Dr. Christel Mormon would like to obtain additional studies such as PET/CT and possible MRI to confirm that the observed lytic lesions are indeed meaningful finding of active multiple myeloma as opposed to artifact on the plain films.  This is certainly a valid concern as the treatment path that patient would be set on, should she have an active disease would be quite intense and will consume at least 2 years of the patient's life.  Based on older strategy which did not include PET/CT, patient does meet criteria for active disease and I would certainly consider treatment if she was anxious about the diagnosis and willing to start the treatment.  Nevertheless, Mrs. Dietzman is more apprehensive about potential side effects and toxicities of therapy and will feel more comfortable delaying the therapy until it is really promising a more significant and substantial benefit.  Plan: --Proceed with PET/CT is recommended by [facility] will also order colonoscopy and mammogram As requested. --Return to my clinic in January to review results of the PET scan if the patient is willing.  Otherwise, she can continue following up at [facility] --If patient is interested in continuing follow-up in our clinic, my plan will be to repeat lab work in approximately 3 months  to monitor for possible biochemical disease progression.  Repeat imaging and bone marrow biopsy would be obtained only on appearance of new symptoms or significant progression of laboratory abnormalities.

## 2017-02-14 NOTE — Progress Notes (Signed)
[facility] Follow-up Visit:  Assessment: Multiple myeloma not having achieved remission [facility]) 62 y.o. female with initial presentation of monoclonal gammopathy of unknown significance in the context of compression fracture of T4 vertebral body, returning after completing initial workup possible multiple myeloma.  Workup confirms presence of active multiple myeloma based on presence of monoclonal gammopathy, scattered skeletal lesions and presence of at least 10% replacement of the bone marrow cellularity with monotypic plasma cell population.  Cytogenetics, FISH, and molecular studies are pending at this time.  Patient has visited for second opinion at [facility] and has been evaluated by Victoria. Victoria Stokes.  After careful evaluation of the patient's history, Victoria. Christel Stokes would like to obtain additional studies such as PET/CT and possible MRI to confirm that the observed lytic lesions are indeed meaningful finding of active multiple myeloma as opposed to artifact on the plain films.  This is certainly a valid concern as the treatment path that patient would be set on, should she have an active disease would be quite intense and will consume at least 2 years of the patient's life.  Based on older strategy which did not include PET/CT, patient does meet criteria for active disease and I would certainly consider treatment if she was anxious about the diagnosis and willing to start the treatment.  Nevertheless, Victoria Stokes is more apprehensive about potential side effects and toxicities of therapy and will feel more comfortable delaying the therapy until it is really promising a more significant and substantial benefit.  Plan: --Proceed with PET/CT is recommended by [facility] will also order colonoscopy and mammogram As requested. --Return to my clinic in January to review results of the PET scan if the patient is willing.  Otherwise, she can continue following up at [facility] --If  patient is interested in continuing follow-up in our clinic, my plan will be to repeat lab work in approximately 3 months to monitor for possible biochemical disease progression.  Repeat imaging and bone marrow biopsy would be obtained only on appearance of new symptoms or significant progression of laboratory abnormalities.   Voice recognition software was used and creation of this note. Despite my best effort at editing the text, some misspelling/errors may have occurred.   Orders Placed This Encounter  Procedures  . CBC with Differential    Standing Status:   Future    Standing Expiration Date:   01/10/2018  . Comprehensive metabolic panel    Standing Status:   Future    Standing Expiration Date:   01/10/2018  . Lactate dehydrogenase (LDH)    Standing Status:   Future    Standing Expiration Date:   01/10/2018  . Beta 2 microglobulin    Standing Status:   Future    Standing Expiration Date:   01/10/2018    Cancer Staging Multiple myeloma not having achieved remission ([place]) Staging form: Plasma Cell Myeloma and Plasma Cell Disorders, AJCC 8th Edition - Clinical: Beta-2-microglobulin (mg/L): 1.7, Albumin (g/dL): 4.6, ISS: Stage I, LDH: Normal - Unsigned   All questions were answered. . The patient knows to call the clinic with any problems, questions or concerns.  This note was electronically signed.    History of Presenting Illness Victoria Stokes is a 62 y.o. female followed in the [place] for Diagnosis of multiple myeloma. Patient has history of infrequent seizure events with the last one occurring on Father's Day in 2018 following a period of outdoors activity with dehydration. Previous events occurred in 1987  and 1985 and similar circumstances and were all associated with significant hyponatremia. In the interim, patient has been seizure-free without any antiseizure medications. Following the latest seizure event, patient has developed significant discomfort in the mid  back. The area had discomfort in the past, but severity has increased. Imaging was obtained demonstrating a compression fracture at T4 with unknown acuity. At this time, pain and discomfort have resolved. Patient denies weakness or paresthesias in the lower extremities. Denies any paresthesia or her bladder or bowel. Patient also indicates Raynaud's phenomenon in bilateral hands, some thinning of her hair, but denies any oral sores. She does have chronic sinus pressure with sinus drainage and sore throat. No significant febrile illness, and no epistaxis or purulent nasal drainage.  Due to a discovery on a compression fracture, additional lab work was obtained demonstrating presence of monoclonal gammopathy leading to referral to our service. Please see the lab work below for details. Subsequently, additional assessment including repeat lab work and additional imaging were obtained. These demonstrate multiple scattered lytic lesions as described below.  Since last visit to the clinic, patient was seen and evaluated by Victoria Stokes at [facility].  Recommendation currently stands at obtaining a PET/CT to assess significance of the discovered small lytic lesions in the skeletal structures as without evidence of more active disease we might be dealing with smoldering myeloma which can be observed at this time.  Patient reports feeling comfortable with this recommendation.  PET/CT will be obtained at [facility] to be evaluated there.  At this time, patient denies any new complaints.  Oncological/hematological History: --Labs, 01/22/12:                                                                                                            WBC 3.0, ANC 1.3, ALC 1.2, Mono 0.3, Eos 0.2, Baso 0.0,  Hgb 12.8, Plt 228; --Labs, 08/01/16: tProt   ..., Alb   ..., Ca 9.7, Cr 0.8, AP  .Marland Kitchen.;                                           WBC 5.0, Nl diff,                                                                      Hgb 13.6, Plt 307    Multiple myeloma not having achieved remission (H[facility])   11/20/2016 Tumor Marker    tProt 8.5, Alb 4.6, Ca 9.9, Cr 0.9, AP 78; LDH 171, beta-2 microglobulin 1.7 SPEP -- M-spike 1.1g/dL, SIFE -- IgG kappa IgG 1840, IgA 203, IgM 86; kappa 15.9, lambda 11.9, KLR 1.34 WBC 5.0, Hgb 13.7, Plt 284  11/26/2016 Imaging    Skeletal Survey: Scattered lytic lesions noted in the skull, lateral right ischium, proximal right femur, in mid right tibia. Areas of periarticular osteoporosis noted.      11/27/2016 Initial Diagnosis    Multiple myeloma not having achieved remission ([place])      12/03/2016 Bone Marrow Biopsy    The bone marrow is generally normocellular for age with trilineage hematopoiesis and essentially orderly and progressive maturation of all myeloid cell lines. In this background, the plasma cells are increased in number representing 10% of all cells with associated atypical cytomorphologic features. Immunohistochemical stains highlight the plasma cells in the marrow which show kappa light chain excess/restriction. In the presence of a monoclonal protein with kappa light chain specificity, the findings are consistent with involvement by plasma cell neoplasm.         Medical History: Past Medical History:  Diagnosis Date  . Low sodium levels    Hx  . Seizures ([place]) 10/29/2016    Surgical History: Past Surgical History:  Procedure Laterality Date  . CESAREAN SECTION      Family History: Family History  Problem Relation Age of Onset  . Breast cancer Mother   . Pancreatic cancer Father   . Seizures Neg Hx     Social History: Social History   Socioeconomic History  . Marital status: Married    Spouse name: Victoria Stokes  . Number of children: 2  . Years of education: 16  . Highest education level: Not on file  Social Needs  . Financial resource strain: Not on file  . Food insecurity - worry: Not on file  . Food insecurity - inability: Not  on file  . Transportation needs - medical: Not on file  . Transportation needs - non-medical: Not on file  Occupational History  . Occupation: Chief Executive Officer  Tobacco Use  . Smoking status: Never Smoker  . Smokeless tobacco: Never Used  Substance and Sexual Activity  . Alcohol use: Yes    Alcohol/week: 0.0 oz    Comment: 2 drinks per month  . Drug use: No  . Sexual activity: Not on file  Other Topics Concern  . Not on file  Social History Narrative   Lives   Caffeine use: 2 cups per day (coffee)   Right handed     Allergies: No Known Allergies  Medications:  Current Outpatient Medications  Medication Sig Dispense Refill  . ALPRAZolam (XANAX) 0.5 MG tablet Take 2 tablets approximately 45 minutes prior to the MRI study, take a third tablet if needed. 3 tablet 0  . B Complex Vitamins (VITAMIN B-COMPLEX PO) Take by mouth.    Marland Kitchen VITAMIN A PO Take by mouth.    Marland Kitchen VITAMIN K PO Take by mouth.    . zolpidem (AMBIEN) 5 MG tablet Take 5 mg by mouth as needed for sleep.    . Cholecalciferol (VITAMIN D PO) Take by mouth.    . CHOLINE PO Take by mouth.    Marland Kitchen LORazepam (ATIVAN) 0.5 MG tablet Take 1 tablet (0.5 mg total) by mouth every 4 (four) hours as needed for anxiety. (Patient not taking: Reported on 12/10/2016) 2 tablet 0   No current facility-administered medications for this visit.    Facility-Administered Medications Ordered in Other Visits  Medication Dose Route Frequency Provider Last Rate Last Dose  . gadopentetate dimeglumine (MAGNEVIST) injection 13 mL  13 mL Intravenous Once PRN Kathrynn Ducking, MD        Review of Systems: Review  of Systems  Constitutional: Negative.   HENT:   Negative for mouth sores and sore throat.        Sinus pressure  Eyes: Negative.   Respiratory: Negative.   Cardiovascular: Negative.   Gastrointestinal: Negative.   Endocrine: Negative.   Genitourinary: Negative.    Musculoskeletal: Positive for back pain.       Raynaud's phenomenon  Skin:  Negative for rash.       Hair thinning  Neurological: Positive for seizures.  Hematological: Negative.   Psychiatric/Behavioral: Negative.      PHYSICAL EXAMINATION Blood pressure 133/77, pulse 72, temperature 98.4 F (36.9 C), temperature source Oral, resp. rate 20, height '5\' 7"'  (1.702 m), weight 145 lb 1.6 oz (65.8 kg), SpO2 100 %.  ECOG PERFORMANCE STATUS: 0 - Asymptomatic  Physical Exam  Constitutional: She is oriented to person, place, and time and well-developed, well-nourished, and in no distress. No distress.  HENT:  Head: Normocephalic and atraumatic.  Mouth/Throat: Oropharynx is clear and moist. No oropharyngeal exudate.  Eyes: Conjunctivae and EOM are normal. Pupils are equal, round, and reactive to light. No scleral icterus.  Neck: Normal range of motion. Neck supple. No thyromegaly present.  Cardiovascular: Normal rate, regular rhythm and normal heart sounds.  No murmur heard. Pulmonary/Chest: Effort normal and breath sounds normal. No respiratory distress. She has no wheezes. She has no rales.  Abdominal: Soft. She exhibits no distension and no mass. There is no tenderness. There is no rebound and no guarding.  Musculoskeletal: Normal range of motion. She exhibits no edema or tenderness.  Lymphadenopathy:    She has no cervical adenopathy.  Neurological: She is alert and oriented to person, place, and time. She has normal reflexes. No cranial nerve deficit.  Skin: Skin is warm and dry. No rash noted. She is not diaphoretic. No erythema. No pallor.     LABORATORY DATA: I have personally reviewed the data as listed: No visits with results within 1 Week(s) from this visit.  Latest known visit with results is:  Hospital Outpatient Visit on 12/03/2016  Component Date Value Ref Range Status  . Chromosome-Routine 12/03/2016 SEE SEPARATE REPORT   Final   Performed at [facility]  . Tissue hybridization (bone marrow)* 12/03/2016 SEE SEPARATE REPORT   Final    Performed at [place], [place]

## 2017-02-21 ENCOUNTER — Ambulatory Visit: Payer: BLUE CROSS/BLUE SHIELD | Admitting: Hematology and Oncology

## 2017-02-21 ENCOUNTER — Other Ambulatory Visit: Payer: BLUE CROSS/BLUE SHIELD

## 2017-02-21 DIAGNOSIS — C9000 Multiple myeloma not having achieved remission: Secondary | ICD-10-CM | POA: Diagnosis not present

## 2017-03-13 DIAGNOSIS — Z1211 Encounter for screening for malignant neoplasm of colon: Secondary | ICD-10-CM | POA: Diagnosis not present

## 2017-03-14 ENCOUNTER — Ambulatory Visit: Payer: BLUE CROSS/BLUE SHIELD | Admitting: Neurology

## 2017-03-28 DIAGNOSIS — C9000 Multiple myeloma not having achieved remission: Secondary | ICD-10-CM | POA: Diagnosis not present

## 2017-04-04 DIAGNOSIS — D472 Monoclonal gammopathy: Secondary | ICD-10-CM | POA: Diagnosis not present

## 2017-04-17 DIAGNOSIS — D2371 Other benign neoplasm of skin of right lower limb, including hip: Secondary | ICD-10-CM | POA: Diagnosis not present

## 2017-04-17 DIAGNOSIS — D2372 Other benign neoplasm of skin of left lower limb, including hip: Secondary | ICD-10-CM | POA: Diagnosis not present

## 2017-04-17 DIAGNOSIS — M79671 Pain in right foot: Secondary | ICD-10-CM | POA: Diagnosis not present

## 2017-04-17 DIAGNOSIS — M79672 Pain in left foot: Secondary | ICD-10-CM | POA: Diagnosis not present

## 2017-04-26 DIAGNOSIS — Z6822 Body mass index (BMI) 22.0-22.9, adult: Secondary | ICD-10-CM | POA: Diagnosis not present

## 2017-04-26 DIAGNOSIS — Z1151 Encounter for screening for human papillomavirus (HPV): Secondary | ICD-10-CM | POA: Diagnosis not present

## 2017-04-26 DIAGNOSIS — Z01419 Encounter for gynecological examination (general) (routine) without abnormal findings: Secondary | ICD-10-CM | POA: Diagnosis not present

## 2017-04-29 ENCOUNTER — Ambulatory Visit: Payer: BLUE CROSS/BLUE SHIELD | Admitting: Adult Health

## 2017-05-07 DIAGNOSIS — M810 Age-related osteoporosis without current pathological fracture: Secondary | ICD-10-CM | POA: Diagnosis not present

## 2017-05-07 DIAGNOSIS — R5383 Other fatigue: Secondary | ICD-10-CM | POA: Diagnosis not present

## 2017-05-07 DIAGNOSIS — Z832 Family history of diseases of the blood and blood-forming organs and certain disorders involving the immune mechanism: Secondary | ICD-10-CM | POA: Diagnosis not present

## 2017-05-07 DIAGNOSIS — D472 Monoclonal gammopathy: Secondary | ICD-10-CM | POA: Diagnosis not present

## 2017-06-17 DIAGNOSIS — C9000 Multiple myeloma not having achieved remission: Secondary | ICD-10-CM | POA: Diagnosis not present

## 2017-06-24 DIAGNOSIS — C9000 Multiple myeloma not having achieved remission: Secondary | ICD-10-CM | POA: Diagnosis not present

## 2017-07-31 DIAGNOSIS — Z87898 Personal history of other specified conditions: Secondary | ICD-10-CM | POA: Diagnosis not present

## 2017-07-31 DIAGNOSIS — M810 Age-related osteoporosis without current pathological fracture: Secondary | ICD-10-CM | POA: Diagnosis not present

## 2017-07-31 DIAGNOSIS — Z0000 Encounter for general adult medical examination without abnormal findings: Secondary | ICD-10-CM | POA: Diagnosis not present

## 2017-07-31 DIAGNOSIS — E871 Hypo-osmolality and hyponatremia: Secondary | ICD-10-CM | POA: Diagnosis not present

## 2017-09-19 DIAGNOSIS — B373 Candidiasis of vulva and vagina: Secondary | ICD-10-CM | POA: Diagnosis not present

## 2017-09-19 DIAGNOSIS — R300 Dysuria: Secondary | ICD-10-CM | POA: Diagnosis not present

## 2017-09-23 DIAGNOSIS — C9000 Multiple myeloma not having achieved remission: Secondary | ICD-10-CM | POA: Diagnosis not present

## 2017-09-30 DIAGNOSIS — D472 Monoclonal gammopathy: Secondary | ICD-10-CM | POA: Diagnosis not present

## 2017-12-02 DIAGNOSIS — H353111 Nonexudative age-related macular degeneration, right eye, early dry stage: Secondary | ICD-10-CM | POA: Diagnosis not present

## 2017-12-12 DIAGNOSIS — C9000 Multiple myeloma not having achieved remission: Secondary | ICD-10-CM | POA: Diagnosis not present

## 2017-12-19 DIAGNOSIS — D472 Monoclonal gammopathy: Secondary | ICD-10-CM | POA: Diagnosis not present

## 2017-12-19 DIAGNOSIS — E8809 Other disorders of plasma-protein metabolism, not elsewhere classified: Secondary | ICD-10-CM | POA: Diagnosis not present

## 2018-02-17 DIAGNOSIS — Z803 Family history of malignant neoplasm of breast: Secondary | ICD-10-CM | POA: Diagnosis not present

## 2018-02-17 DIAGNOSIS — Z1231 Encounter for screening mammogram for malignant neoplasm of breast: Secondary | ICD-10-CM | POA: Diagnosis not present

## 2018-03-20 ENCOUNTER — Other Ambulatory Visit: Payer: Self-pay | Admitting: Nurse Practitioner

## 2018-04-09 DIAGNOSIS — E8809 Other disorders of plasma-protein metabolism, not elsewhere classified: Secondary | ICD-10-CM | POA: Diagnosis not present

## 2018-04-17 DIAGNOSIS — D472 Monoclonal gammopathy: Secondary | ICD-10-CM | POA: Diagnosis not present

## 2018-04-17 DIAGNOSIS — E8809 Other disorders of plasma-protein metabolism, not elsewhere classified: Secondary | ICD-10-CM | POA: Diagnosis not present

## 2018-06-10 DIAGNOSIS — H00024 Hordeolum internum left upper eyelid: Secondary | ICD-10-CM | POA: Diagnosis not present

## 2018-07-24 DIAGNOSIS — D472 Monoclonal gammopathy: Secondary | ICD-10-CM | POA: Diagnosis not present

## 2018-07-24 DIAGNOSIS — E8809 Other disorders of plasma-protein metabolism, not elsewhere classified: Secondary | ICD-10-CM | POA: Diagnosis not present

## 2018-07-31 DIAGNOSIS — D472 Monoclonal gammopathy: Secondary | ICD-10-CM | POA: Diagnosis not present

## 2018-08-07 DIAGNOSIS — Z6820 Body mass index (BMI) 20.0-20.9, adult: Secondary | ICD-10-CM | POA: Diagnosis not present

## 2018-08-07 DIAGNOSIS — Z01419 Encounter for gynecological examination (general) (routine) without abnormal findings: Secondary | ICD-10-CM | POA: Diagnosis not present

## 2018-09-01 DIAGNOSIS — R509 Fever, unspecified: Secondary | ICD-10-CM | POA: Diagnosis not present

## 2018-09-01 DIAGNOSIS — J029 Acute pharyngitis, unspecified: Secondary | ICD-10-CM | POA: Diagnosis not present

## 2018-09-01 DIAGNOSIS — R05 Cough: Secondary | ICD-10-CM | POA: Diagnosis not present

## 2018-09-01 DIAGNOSIS — R52 Pain, unspecified: Secondary | ICD-10-CM | POA: Diagnosis not present

## 2018-09-12 DIAGNOSIS — R509 Fever, unspecified: Secondary | ICD-10-CM | POA: Diagnosis not present

## 2018-09-12 DIAGNOSIS — R0789 Other chest pain: Secondary | ICD-10-CM | POA: Diagnosis not present

## 2018-09-12 DIAGNOSIS — J019 Acute sinusitis, unspecified: Secondary | ICD-10-CM | POA: Diagnosis not present

## 2018-09-12 DIAGNOSIS — R05 Cough: Secondary | ICD-10-CM | POA: Diagnosis not present

## 2018-09-19 DIAGNOSIS — R5381 Other malaise: Secondary | ICD-10-CM | POA: Diagnosis not present

## 2018-09-19 DIAGNOSIS — M2550 Pain in unspecified joint: Secondary | ICD-10-CM | POA: Diagnosis not present

## 2018-09-23 DIAGNOSIS — E871 Hypo-osmolality and hyponatremia: Secondary | ICD-10-CM | POA: Diagnosis not present

## 2018-09-23 DIAGNOSIS — G933 Postviral fatigue syndrome: Secondary | ICD-10-CM | POA: Diagnosis not present

## 2018-09-23 DIAGNOSIS — D472 Monoclonal gammopathy: Secondary | ICD-10-CM | POA: Diagnosis not present

## 2018-10-01 DIAGNOSIS — R140 Abdominal distension (gaseous): Secondary | ICD-10-CM | POA: Diagnosis not present

## 2018-10-23 DIAGNOSIS — R197 Diarrhea, unspecified: Secondary | ICD-10-CM | POA: Diagnosis not present

## 2018-10-23 DIAGNOSIS — R509 Fever, unspecified: Secondary | ICD-10-CM | POA: Diagnosis not present

## 2018-10-23 DIAGNOSIS — R5383 Other fatigue: Secondary | ICD-10-CM | POA: Diagnosis not present

## 2018-10-23 DIAGNOSIS — R946 Abnormal results of thyroid function studies: Secondary | ICD-10-CM | POA: Diagnosis not present

## 2018-10-31 DIAGNOSIS — G43B0 Ophthalmoplegic migraine, not intractable: Secondary | ICD-10-CM | POA: Diagnosis not present

## 2018-11-14 DIAGNOSIS — D472 Monoclonal gammopathy: Secondary | ICD-10-CM | POA: Diagnosis not present

## 2018-11-20 DIAGNOSIS — D472 Monoclonal gammopathy: Secondary | ICD-10-CM | POA: Diagnosis not present

## 2018-12-23 DIAGNOSIS — M810 Age-related osteoporosis without current pathological fracture: Secondary | ICD-10-CM | POA: Diagnosis not present

## 2018-12-23 DIAGNOSIS — Z13220 Encounter for screening for lipoid disorders: Secondary | ICD-10-CM | POA: Diagnosis not present

## 2018-12-23 DIAGNOSIS — M79642 Pain in left hand: Secondary | ICD-10-CM | POA: Diagnosis not present

## 2018-12-23 DIAGNOSIS — Z23 Encounter for immunization: Secondary | ICD-10-CM | POA: Diagnosis not present

## 2018-12-23 DIAGNOSIS — H6123 Impacted cerumen, bilateral: Secondary | ICD-10-CM | POA: Diagnosis not present

## 2018-12-23 DIAGNOSIS — G933 Postviral fatigue syndrome: Secondary | ICD-10-CM | POA: Diagnosis not present

## 2018-12-23 DIAGNOSIS — Z0000 Encounter for general adult medical examination without abnormal findings: Secondary | ICD-10-CM | POA: Diagnosis not present

## 2019-04-23 DIAGNOSIS — R509 Fever, unspecified: Secondary | ICD-10-CM | POA: Diagnosis not present

## 2019-04-24 DIAGNOSIS — R05 Cough: Secondary | ICD-10-CM | POA: Diagnosis not present

## 2019-04-24 DIAGNOSIS — R7982 Elevated C-reactive protein (CRP): Secondary | ICD-10-CM | POA: Diagnosis not present

## 2019-04-24 DIAGNOSIS — R899 Unspecified abnormal finding in specimens from other organs, systems and tissues: Secondary | ICD-10-CM | POA: Diagnosis not present

## 2019-04-30 ENCOUNTER — Emergency Department (HOSPITAL_COMMUNITY): Payer: 59

## 2019-04-30 ENCOUNTER — Emergency Department (HOSPITAL_COMMUNITY)
Admission: EM | Admit: 2019-04-30 | Discharge: 2019-04-30 | Disposition: A | Discharge status: Home / Self Care | Payer: 59 | Attending: Emergency Medicine | Admitting: Emergency Medicine

## 2019-04-30 ENCOUNTER — Encounter (HOSPITAL_COMMUNITY): Payer: Self-pay | Admitting: Emergency Medicine

## 2019-04-30 ENCOUNTER — Other Ambulatory Visit: Payer: Self-pay

## 2019-04-30 DIAGNOSIS — E871 Hypo-osmolality and hyponatremia: Secondary | ICD-10-CM | POA: Diagnosis not present

## 2019-04-30 DIAGNOSIS — D472 Monoclonal gammopathy: Secondary | ICD-10-CM | POA: Diagnosis not present

## 2019-04-30 DIAGNOSIS — R531 Weakness: Secondary | ICD-10-CM | POA: Diagnosis not present

## 2019-04-30 DIAGNOSIS — Z20822 Contact with and (suspected) exposure to covid-19: Secondary | ICD-10-CM | POA: Insufficient documentation

## 2019-04-30 DIAGNOSIS — R0602 Shortness of breath: Secondary | ICD-10-CM | POA: Diagnosis not present

## 2019-04-30 DIAGNOSIS — R509 Fever, unspecified: Secondary | ICD-10-CM | POA: Insufficient documentation

## 2019-04-30 DIAGNOSIS — R112 Nausea with vomiting, unspecified: Secondary | ICD-10-CM | POA: Diagnosis not present

## 2019-04-30 DIAGNOSIS — R079 Chest pain, unspecified: Secondary | ICD-10-CM | POA: Diagnosis not present

## 2019-04-30 DIAGNOSIS — R05 Cough: Secondary | ICD-10-CM | POA: Diagnosis not present

## 2019-04-30 DIAGNOSIS — R0789 Other chest pain: Secondary | ICD-10-CM | POA: Diagnosis present

## 2019-04-30 LAB — CBC
HCT: 34 % — ABNORMAL LOW (ref 36.0–46.0)
Hemoglobin: 11.6 g/dL — ABNORMAL LOW (ref 12.0–15.0)
MCH: 30 pg (ref 26.0–34.0)
MCHC: 34.1 g/dL (ref 30.0–36.0)
MCV: 87.9 fL (ref 80.0–100.0)
Platelets: 353 10*3/uL (ref 150–400)
RBC: 3.87 MIL/uL (ref 3.87–5.11)
RDW: 12.7 % (ref 11.5–15.5)
WBC: 7 10*3/uL (ref 4.0–10.5)
nRBC: 0 % (ref 0.0–0.2)

## 2019-04-30 LAB — URINALYSIS, ROUTINE W REFLEX MICROSCOPIC
Bilirubin Urine: NEGATIVE
Glucose, UA: NEGATIVE mg/dL
Hgb urine dipstick: NEGATIVE
Ketones, ur: 20 mg/dL — AB
Leukocytes,Ua: NEGATIVE
Nitrite: NEGATIVE
Protein, ur: NEGATIVE mg/dL
Specific Gravity, Urine: 1.011 (ref 1.005–1.030)
pH: 6 (ref 5.0–8.0)

## 2019-04-30 LAB — LACTIC ACID, PLASMA
Lactic Acid, Venous: 1.2 mmol/L (ref 0.5–1.9)
Lactic Acid, Venous: 0.9 mmol/L (ref 0.5–1.9)

## 2019-04-30 LAB — HEPATIC FUNCTION PANEL
ALT: 18 U/L (ref 0–44)
AST: 27 U/L (ref 15–41)
Albumin: 3.3 g/dL — ABNORMAL LOW (ref 3.5–5.0)
Alkaline Phosphatase: 59 U/L (ref 38–126)
Bilirubin, Direct: 0.1 mg/dL (ref 0.0–0.2)
Indirect Bilirubin: 0.9 mg/dL (ref 0.3–0.9)
Total Bilirubin: 1 mg/dL (ref 0.3–1.2)
Total Protein: 7 g/dL (ref 6.5–8.1)

## 2019-04-30 LAB — BASIC METABOLIC PANEL
Anion gap: 13 (ref 5–15)
Anion gap: 13 (ref 5–15)
BUN: 7 mg/dL — ABNORMAL LOW (ref 8–23)
BUN: 6 mg/dL — ABNORMAL LOW (ref 8–23)
CO2: 22 mmol/L (ref 22–32)
CO2: 20 mmol/L — ABNORMAL LOW (ref 22–32)
Calcium: 8.8 mg/dL — ABNORMAL LOW (ref 8.9–10.3)
Calcium: 8.5 mg/dL — ABNORMAL LOW (ref 8.9–10.3)
Chloride: 87 mmol/L — ABNORMAL LOW (ref 98–111)
Chloride: 91 mmol/L — ABNORMAL LOW (ref 98–111)
Creatinine, Ser: 0.93 mg/dL (ref 0.44–1.00)
Creatinine, Ser: 0.73 mg/dL (ref 0.44–1.00)
GFR calc Af Amer: >60 mL/min (ref >60)
GFR calc Af Amer: >60 mL/min (ref >60)
GFR calc non Af Amer: >60 mL/min (ref >60)
GFR calc non Af Amer: >60 mL/min (ref >60)
Glucose, Bld: 109 mg/dL — ABNORMAL HIGH (ref 70–99)
Glucose, Bld: 108 mg/dL — ABNORMAL HIGH (ref 70–99)
Potassium: 4.6 mmol/L (ref 3.5–5.1)
Potassium: 4.2 mmol/L (ref 3.5–5.1)
Sodium: 122 mmol/L — ABNORMAL LOW (ref 135–145)
Sodium: 124 mmol/L — ABNORMAL LOW (ref 135–145)

## 2019-04-30 LAB — SODIUM, URINE, RANDOM: Sodium, Ur: <10 mmol/L

## 2019-04-30 LAB — OSMOLALITY, URINE: Osmolality, Ur: 348 mOsm/kg (ref 300–900)

## 2019-04-30 LAB — RESPIRATORY PANEL BY RT PCR (FLU A&B, COVID)
Influenza A by PCR: NEGATIVE
Influenza B by PCR: NEGATIVE
SARS Coronavirus 2 by RT PCR: NEGATIVE

## 2019-04-30 LAB — OSMOLALITY: Osmolality: 253 mOsm/kg — ABNORMAL LOW (ref 275–295)

## 2019-04-30 LAB — TROPONIN I (HIGH SENSITIVITY)
Troponin I (High Sensitivity): 8 ng/L (ref <18)
Troponin I (High Sensitivity): 9 ng/L (ref <18)

## 2019-04-30 LAB — CREATININE, URINE, RANDOM: Creatinine, Urine: 104.74 mg/dL

## 2019-04-30 LAB — LIPASE, BLOOD: Lipase: 22 U/L (ref 11–51)

## 2019-04-30 LAB — TSH: TSH: 1.312 u[IU]/mL (ref 0.350–4.500)

## 2019-04-30 MED ORDER — IOHEXOL 350 MG/ML SOLN
100.0000 mL | Freq: Once | INTRAVENOUS | Status: AC | PRN
  Administered 2019-04-30: 18:00:00 100 mL via INTRAVENOUS

## 2019-04-30 MED ORDER — SODIUM CHLORIDE 0.9 % IV BOLUS
1000.0000 mL | Freq: Once | INTRAVENOUS | Status: AC
  Administered 2019-04-30: 1000 mL via INTRAVENOUS

## 2019-04-30 MED ORDER — SODIUM CHLORIDE 0.9% FLUSH
3.0000 mL | Freq: Once | INTRAVENOUS | Status: AC
  Administered 2019-04-30: 3 mL via INTRAVENOUS

## 2019-04-30 NOTE — ED Provider Notes (Signed)
[facility] EMERGENCY DEPARTMENT Provider Note   CSN: [id number] Arrival date & time: 04/30/19  1220     History Chief Complaint  Patient presents with  . Chest Pain  . Shortness of Breath  . Fever    Victoria Stokes is a 64 y.o. female.  Victoria Stokes is a 64 year old woman presenting to the ED with worsened chest pain, shortness of breath and fatigue.  She has a previous medical history significant for chronic hyponatremia (baseline range 128-130) and MGUS.  She reports that this episode of illness started about 6 weeks ago.  She has been having chest pain, shortness of breath and mild fatigue in that time.  She has been following with her primary care physician and has had some baseline labs drawn in that time.  Earlier this week, she had a chest x-ray that was remarkable for a left pleural effusion.  In the past day, she reports a fever up to 100.5 and significantly worsened chest pain, shortness of breath and fatigue this morning.  She reports that she had excruciating chest pain this morning and she was forced to be walking on all fours around her bathroom.  This episode was accompanied by significant nausea and one episode of NB/NB emesis.  In addition to episode of emesis this morning, she also experienced some right jaw/lip numbness transiently.  This numbness has since resolved without issue. She denies stomach pain.  She describes her chest pain as a sharp chest pain in the center of her chest.  It varies in severity between 6/10 and 10/10.  This chest pain does not seem to be related to exertion she denies any associated arm pain, back pain, jaw pain.  Due to her recent fever and worsening symptoms this morning, she decided to be assessed in the emergency department.  She reports a similar incidence of chest pain, shortness of breath and fatigue roughly 10 months ago.  After a series of testing, including negative Covid testing, no diagnosis was found and she was ultimately  told that she was likely experiencing a post viral syndrome with a prolonged recovery.   Regarding her past medical history, she is notable for hyponatremia with previous seizure episodes.  She reports her baseline sodium level is 128-130.  She has had 3 episodes of seizures in the past at least 1 of which was related to hyponatremia.  She has previously been on antiepileptic medication which has been removed due a prolonged period without seizures on medication.  She currently follows with oncologist for her MGUS and is a monitored regularly.  She is not currently receiving treatment for this issue at this time.        Past Medical History:  Diagnosis Date  . Low sodium levels    Hx  . Seizures ([place]) 10/29/2016    Patient Active Problem List   Diagnosis Date Noted  . Multiple myeloma not having achieved remission ([place]) 11/27/2016  . Seizures ([place]) 10/29/2016  . Right knee pain 04/14/2014  . Idiopathic scoliosis 02/24/2014    Past Surgical History:  Procedure Laterality Date  . CESAREAN SECTION       OB History   No obstetric history on file.     Family History  Problem Relation Age of Onset  . Breast cancer Mother   . Pancreatic cancer Father   . Seizures Neg Hx     Social History   Tobacco Use  . Smoking status: Never Smoker  . Smokeless tobacco:  Never Used  Substance Use Topics  . Alcohol use: Yes    Alcohol/week: 0.0 standard drinks    Comment: 2 drinks per month  . Drug use: No    Home Medications Prior to Admission medications   Medication Sig Start Date End Date Taking? Authorizing Provider  B Complex Vitamins (VITAMIN B-COMPLEX PO) Take 1 tablet by mouth daily.    Yes [provider]  Cholecalciferol (VITAMIN D PO) Take 5,000 Units by mouth daily.    Yes [provider]  CHOLINE PO Take 1 tablet by mouth daily.    Yes [provider]  Melatonin 10 MG TABS Take 10 mg by mouth at bedtime.    Yes [provider]   valACYclovir (VALTREX) 500 MG tablet Take 500 mg by mouth 2 (two) times daily as needed. Cold sores 03/11/19  Yes [provider]  VITAMIN A PO Take 1 tablet by mouth daily.    Yes [provider]  VITAMIN K PO Take 1 tablet by mouth daily.    Yes [provider]  zolpidem (AMBIEN) 5 MG tablet Take 5 mg by mouth as needed for sleep.   Yes [provider]  ALPRAZolam Duanne Moron) 0.5 MG tablet Take 2 tablets approximately 45 minutes prior to the MRI study, take a third tablet if needed. Patient not taking: Reported on 04/30/2019 12/17/16   Kathrynn Ducking, MD  LORazepam (ATIVAN) 0.5 MG tablet Take 1 tablet (0.5 mg total) by mouth every 4 (four) hours as needed for anxiety. Patient not taking: Reported on 12/10/2016 11/27/16   Ardath Sax, MD    Allergies    Patient has no known allergies.  Review of Systems   Review of Systems  Constitutional: Positive for appetite change, fatigue and fever.  HENT: Negative for congestion and sore throat.   Eyes: Negative for visual disturbance.  Respiratory: Positive for cough and shortness of breath. Negative for wheezing and stridor.   Cardiovascular: Positive for chest pain. Negative for palpitations and leg swelling.  Gastrointestinal: Positive for nausea and vomiting. Negative for abdominal pain and blood in stool.  Endocrine: Negative for polyuria.  Genitourinary: Negative for dysuria.  Musculoskeletal: Positive for myalgias. Negative for arthralgias and neck stiffness.  Skin: Negative for rash and wound.  Neurological: Positive for weakness. Negative for tremors, syncope and headaches.  Psychiatric/Behavioral: Negative.     Physical Exam Updated Vital Signs BP 131/78 (BP Location: Left Arm)   Pulse 92   Temp 99.3 F (37.4 C) (Oral)   Resp 16   Ht _0  (1.702 m)   Wt 63.5 kg   SpO2 97%   BMI 21.93 kg/m   Physical Exam Constitutional:      General: She is not in acute distress.    Appearance:  She is well-developed and normal weight. She is not ill-appearing.  HENT:     Head: Normocephalic and atraumatic.  Eyes:     Extraocular Movements: Extraocular movements intact.     Pupils: Pupils are equal, round, and reactive to light.  Neck:     Vascular: No JVD.  Cardiovascular:     Rate and Rhythm: Normal rate and regular rhythm.     Chest Wall: PMI is not displaced.     Heart sounds: Murmur present. Systolic murmur present with a grade of 2/6.  Pulmonary:     Effort: Pulmonary effort is normal.     Breath sounds: Examination of the left-lower field reveals rales. Rales present. No decreased  breath sounds.  Abdominal:     General: Bowel sounds are normal.     Palpations: Abdomen is soft.     Tenderness: There is no guarding.  Musculoskeletal:        General: Normal range of motion.     Cervical back: Normal range of motion and neck supple.     Right lower leg: No tenderness. No edema.  Skin:    General: Skin is warm and dry.  Neurological:     General: No focal deficit present.     Mental Status: She is alert and oriented to person, place, and time.     Cranial Nerves: No cranial nerve deficit.  Psychiatric:        Mood and Affect: Mood normal.        Behavior: Behavior normal.     ED Results / Procedures / Treatments   Labs (all labs ordered are listed, but only abnormal results are displayed) Labs Reviewed  BASIC METABOLIC PANEL - Abnormal; Notable for the following components:      Result Value   Sodium 122 (*)    Chloride 87 (*)    Glucose, Bld 109 (*)    BUN 7 (*)    Calcium 8.8 (*)    All other components within normal limits  CBC - Abnormal; Notable for the following components:   Hemoglobin 11.6 (*)    HCT 34.0 (*)    All other components within normal limits  CULTURE, BLOOD (ROUTINE X 2)  CULTURE, BLOOD (ROUTINE X 2)  RESPIRATORY PANEL BY RT PCR (FLU A&B, COVID)  OSMOLALITY, URINE  URINALYSIS, ROUTINE W REFLEX MICROSCOPIC  LIPASE, BLOOD  HEPATIC  FUNCTION PANEL  LACTIC ACID, PLASMA  LACTIC ACID, PLASMA  OSMOLALITY  TSH  SODIUM, URINE, RANDOM  CREATININE, URINE, RANDOM  TROPONIN I (HIGH SENSITIVITY)  TROPONIN I (HIGH SENSITIVITY)    EKG EKG Interpretation  Date/Time:  Thursday April 30 2019 12:22:51 EDT Ventricular Rate:  95 PR Interval:  202 QRS Duration: 78 QT Interval:  342 QTC Calculation: 429 R Axis:   42 Text Interpretation: Sinus rhythm with occasional Premature ventricular complexes and Fusion complexes Otherwise normal ECG PVC new since previous Confirmed by Wandra Arthurs [id number]) on 04/30/2019 1:41:24 PM   Radiology DG Chest 2 View  Result Date: 04/30/2019 CLINICAL DATA:  Chest pain and vomiting since last night which shortness-of-breath 6 weeks. Fever last night. Treated at [facility] for autoimmune problems. EXAM: CHEST - 2 VIEW COMPARISON:  04/24/2019 FINDINGS: Lungs are adequately inflated with mild blunting of the left costophrenic angle and streaky density in the left base likely small amount of left pleural fluid with atelectasis. Right lung is clear. Cardiomediastinal silhouette and remainder of the exam is unchanged. IMPRESSION: Minimal streaky left base density with blunting of the left costophrenic angle likely small amount of pleural fluid with left basilar atelectasis. Electronically Signed   By: Marin Olp M.D.   On: 04/30/2019 12:55    Procedures Procedures (including critical care time)  Medications Ordered in ED Medications  sodium chloride flush (NS) 0.9 % injection 3 mL (3 mLs Intravenous Given 04/30/19 1519)  sodium chloride 0.9 % bolus 1,000 mL (1,000 mLs Intravenous New Bag/Given 04/30/19 1518)    ED Course  I have reviewed the triage vital signs and the nursing notes.  Pertinent labs & imaging results that were available during my care of the patient were reviewed by me and considered in my medical decision making (see chart for  details).    MDM Rules/Calculators/A&P                       Victoria Stokes is a 64 year old woman presenting to the ED with worsening chest pain, shortness of breath and fatigue.  She has myriad symptoms which certainly difficult to connect.  On the one hand, her recent fever in addition to myalgias nausea, vomiting are suspicious for new onset infectious etiology.  She has been vaccinated for Covid.  Will follow up with flu/Covid swab.  Regarding her hyponatremia.  Possibly related to decreased p.o. intake.  She is clearly hypovolemic on exam.  May be related to infectious etiology/SIADH.  Suspicion for lung infection/process based on left pleural effusion.  We will follow-up with CTA chest.  We will begin with normal saline infusion and follow-up CTA chest for further characterization pleural effusion and possible pulmonary etiology.  Plan for likely admission following work-up.   Final Clinical Impression(s) / ED Diagnoses Final diagnoses:  None    Rx / DC Orders ED Discharge Orders    None       Matilde Haymaker, MD 04/30/19 1547    Drenda Freeze, MD 05/01/19 [phone]

## 2019-04-30 NOTE — ED Notes (Signed)
Pt transported to CT at this time.

## 2019-04-30 NOTE — ED Provider Notes (Signed)
Assumed care of patient at 4 PM with hypernatremia and pending scans.  Patient CT dense of PE, trace bilateral pleural effusions, age-indeterminate vertebral body fracture which is thought to be chronic and periportal edema with mild gallbladder wall thickness which is nonspecific.  Patient's laboratory findings of liver function test are within normal limits.  On reevaluation patient is well-appearing and states she would rather go home.  Repeat BMP with the sodium of 124.  Will discuss with the patient and family member.  She has close follow-up and feel that it is reasonable to go home and increase her salt intake.   Blanchie Dessert, MD 04/30/19 2147

## 2019-04-30 NOTE — ED Notes (Signed)
Pt returned from CT at this time. Placed pt back on monitor, pt placed in position of comfort and advised of wait status.   

## 2019-04-30 NOTE — Discharge Instructions (Signed)
Also try to avoid drinking excessive amounts of plain water.  Make sure there is sodium in it.

## 2019-04-30 NOTE — ED Triage Notes (Signed)
Pt presents with complaints of chest pain and shortness of breath for 6 weeks. Fever last night of 102. She receives treatment at [facility] (hematology).

## 2019-05-01 DIAGNOSIS — E871 Hypo-osmolality and hyponatremia: Secondary | ICD-10-CM | POA: Diagnosis not present

## 2019-05-01 DIAGNOSIS — D509 Iron deficiency anemia, unspecified: Secondary | ICD-10-CM | POA: Diagnosis not present

## 2019-05-01 DIAGNOSIS — R7982 Elevated C-reactive protein (CRP): Secondary | ICD-10-CM | POA: Diagnosis not present

## 2019-05-01 DIAGNOSIS — D472 Monoclonal gammopathy: Secondary | ICD-10-CM | POA: Diagnosis not present

## 2019-05-04 DIAGNOSIS — D472 Monoclonal gammopathy: Secondary | ICD-10-CM | POA: Diagnosis not present

## 2019-05-05 LAB — CULTURE, BLOOD (ROUTINE X 2)
Culture: NO GROWTH
Culture: NO GROWTH
Special Requests: ADEQUATE

## 2019-05-06 ENCOUNTER — Inpatient Hospital Stay (HOSPITAL_COMMUNITY)
Admission: EM | Admit: 2019-05-06 | Discharge: 2019-05-11 | DRG: 314 | Disposition: A | Discharge status: Home / Self Care | Payer: 59 | Attending: Internal Medicine | Admitting: Internal Medicine

## 2019-05-06 ENCOUNTER — Other Ambulatory Visit: Payer: Self-pay

## 2019-05-06 ENCOUNTER — Emergency Department (HOSPITAL_COMMUNITY): Payer: 59

## 2019-05-06 ENCOUNTER — Observation Stay (HOSPITAL_BASED_OUTPATIENT_CLINIC_OR_DEPARTMENT_OTHER): Payer: 59

## 2019-05-06 DIAGNOSIS — D472 Monoclonal gammopathy: Secondary | ICD-10-CM | POA: Diagnosis present

## 2019-05-06 DIAGNOSIS — J189 Pneumonia, unspecified organism: Secondary | ICD-10-CM | POA: Diagnosis not present

## 2019-05-06 DIAGNOSIS — J90 Pleural effusion, not elsewhere classified: Secondary | ICD-10-CM | POA: Diagnosis not present

## 2019-05-06 DIAGNOSIS — I3139 Other pericardial effusion (noninflammatory): Secondary | ICD-10-CM

## 2019-05-06 DIAGNOSIS — K5900 Constipation, unspecified: Secondary | ICD-10-CM | POA: Diagnosis not present

## 2019-05-06 DIAGNOSIS — R079 Chest pain, unspecified: Secondary | ICD-10-CM

## 2019-05-06 DIAGNOSIS — K521 Toxic gastroenteritis and colitis: Secondary | ICD-10-CM | POA: Diagnosis not present

## 2019-05-06 DIAGNOSIS — E860 Dehydration: Secondary | ICD-10-CM | POA: Diagnosis not present

## 2019-05-06 DIAGNOSIS — Z79890 Hormone replacement therapy (postmenopausal): Secondary | ICD-10-CM

## 2019-05-06 DIAGNOSIS — K3189 Other diseases of stomach and duodenum: Secondary | ICD-10-CM

## 2019-05-06 DIAGNOSIS — Z91018 Allergy to other foods: Secondary | ICD-10-CM

## 2019-05-06 DIAGNOSIS — D649 Anemia, unspecified: Secondary | ICD-10-CM | POA: Diagnosis present

## 2019-05-06 DIAGNOSIS — Z20822 Contact with and (suspected) exposure to covid-19: Secondary | ICD-10-CM | POA: Diagnosis not present

## 2019-05-06 DIAGNOSIS — Z79899 Other long term (current) drug therapy: Secondary | ICD-10-CM

## 2019-05-06 DIAGNOSIS — I309 Acute pericarditis, unspecified: Secondary | ICD-10-CM | POA: Diagnosis not present

## 2019-05-06 DIAGNOSIS — E871 Hypo-osmolality and hyponatremia: Secondary | ICD-10-CM

## 2019-05-06 DIAGNOSIS — T504X5A Adverse effect of drugs affecting uric acid metabolism, initial encounter: Secondary | ICD-10-CM | POA: Diagnosis not present

## 2019-05-06 DIAGNOSIS — I319 Disease of pericardium, unspecified: Secondary | ICD-10-CM | POA: Diagnosis present

## 2019-05-06 DIAGNOSIS — R0781 Pleurodynia: Secondary | ICD-10-CM

## 2019-05-06 DIAGNOSIS — I318 Other specified diseases of pericardium: Secondary | ICD-10-CM | POA: Diagnosis not present

## 2019-05-06 DIAGNOSIS — Y92230 Patient room in hospital as the place of occurrence of the external cause: Secondary | ICD-10-CM | POA: Diagnosis not present

## 2019-05-06 DIAGNOSIS — R509 Fever, unspecified: Secondary | ICD-10-CM | POA: Diagnosis not present

## 2019-05-06 DIAGNOSIS — J9811 Atelectasis: Secondary | ICD-10-CM | POA: Diagnosis not present

## 2019-05-06 DIAGNOSIS — I313 Pericardial effusion (noninflammatory): Secondary | ICD-10-CM

## 2019-05-06 DIAGNOSIS — R140 Abdominal distension (gaseous): Secondary | ICD-10-CM | POA: Diagnosis not present

## 2019-05-06 LAB — CREATININE, URINE, RANDOM: Creatinine, Urine: 91.68 mg/dL

## 2019-05-06 LAB — CBC
HCT: 31 % — ABNORMAL LOW (ref 36.0–46.0)
Hemoglobin: 10.3 g/dL — ABNORMAL LOW (ref 12.0–15.0)
MCH: 29.4 pg (ref 26.0–34.0)
MCHC: 33.2 g/dL (ref 30.0–36.0)
MCV: 88.6 fL (ref 80.0–100.0)
Platelets: 387 10*3/uL (ref 150–400)
RBC: 3.5 MIL/uL — ABNORMAL LOW (ref 3.87–5.11)
RDW: 12.9 % (ref 11.5–15.5)
WBC: 6.6 10*3/uL (ref 4.0–10.5)
nRBC: 0 % (ref 0.0–0.2)

## 2019-05-06 LAB — LACTIC ACID, PLASMA
Lactic Acid, Venous: 1.4 mmol/L (ref 0.5–1.9)
Lactic Acid, Venous: 1.2 mmol/L (ref 0.5–1.9)

## 2019-05-06 LAB — URINALYSIS, ROUTINE W REFLEX MICROSCOPIC
Bilirubin Urine: NEGATIVE
Glucose, UA: NEGATIVE mg/dL
Hgb urine dipstick: NEGATIVE
Ketones, ur: 5 mg/dL — AB
Leukocytes,Ua: NEGATIVE
Nitrite: NEGATIVE
Protein, ur: NEGATIVE mg/dL
Specific Gravity, Urine: 1.014 (ref 1.005–1.030)
pH: 6 (ref 5.0–8.0)

## 2019-05-06 LAB — SARS CORONAVIRUS 2 (TAT 6-24 HRS): SARS Coronavirus 2: NEGATIVE

## 2019-05-06 LAB — OSMOLALITY: Osmolality: 248 mOsm/kg — CL (ref 275–295)

## 2019-05-06 LAB — BASIC METABOLIC PANEL
Anion gap: 13 (ref 5–15)
BUN: 7 mg/dL — ABNORMAL LOW (ref 8–23)
CO2: 23 mmol/L (ref 22–32)
Calcium: 8.4 mg/dL — ABNORMAL LOW (ref 8.9–10.3)
Chloride: 89 mmol/L — ABNORMAL LOW (ref 98–111)
Creatinine, Ser: 0.69 mg/dL (ref 0.44–1.00)
GFR calc Af Amer: >60 mL/min (ref >60)
GFR calc non Af Amer: >60 mL/min (ref >60)
Glucose, Bld: 109 mg/dL — ABNORMAL HIGH (ref 70–99)
Potassium: 4.4 mmol/L (ref 3.5–5.1)
Sodium: 125 mmol/L — ABNORMAL LOW (ref 135–145)

## 2019-05-06 LAB — TROPONIN I (HIGH SENSITIVITY)
Troponin I (High Sensitivity): 24 ng/L — ABNORMAL HIGH (ref <18)
Troponin I (High Sensitivity): 20 ng/L — ABNORMAL HIGH (ref <18)

## 2019-05-06 LAB — HEMOGLOBIN A1C
Hgb A1c MFr Bld: 5.5 % (ref 4.8–5.6)
Mean Plasma Glucose: 111.15 mg/dL

## 2019-05-06 LAB — ECHOCARDIOGRAM COMPLETE
Height: 67 in
Weight: 2222.24 oz

## 2019-05-06 LAB — HIV ANTIBODY (ROUTINE TESTING W REFLEX): HIV Screen 4th Generation wRfx: NONREACTIVE

## 2019-05-06 LAB — OSMOLALITY, URINE: Osmolality, Ur: 366 mOsm/kg (ref 300–900)

## 2019-05-06 LAB — C-REACTIVE PROTEIN: CRP: 16.2 mg/dL — ABNORMAL HIGH (ref <1.0)

## 2019-05-06 LAB — SEDIMENTATION RATE: Sed Rate: 17 mm/hr (ref 0–22)

## 2019-05-06 MED ORDER — VITAMIN B-12 100 MCG PO TABS
100.0000 ug | ORAL_TABLET | Freq: Every day | ORAL | Status: DC
  Administered 2019-05-07 – 2019-05-11 (×5): 100 ug via ORAL
  Filled 2019-05-06 (×5): qty 1

## 2019-05-06 MED ORDER — VITAMIN D 25 MCG (1000 UNIT) PO TABS
5000.0000 [IU] | ORAL_TABLET | Freq: Every day | ORAL | Status: DC
  Administered 2019-05-06 – 2019-05-11 (×6): 5000 [IU] via ORAL
  Filled 2019-05-06 (×6): qty 5

## 2019-05-06 MED ORDER — CHOLINE 65 MG PO TABS: ORAL_TABLET | Freq: Every day | ORAL | Status: DC

## 2019-05-06 MED ORDER — MELATONIN 3 MG PO TABS
9.0000 mg | ORAL_TABLET | Freq: Every day | ORAL | Status: DC
  Administered 2019-05-06 – 2019-05-10 (×5): 9 mg via ORAL
  Filled 2019-05-06 (×6): qty 3

## 2019-05-06 MED ORDER — IBUPROFEN 400 MG PO TABS
600.0000 mg | ORAL_TABLET | Freq: Three times a day (TID) | ORAL | Status: DC
  Administered 2019-05-06 – 2019-05-08 (×5): 600 mg via ORAL
  Filled 2019-05-06 (×5): qty 1

## 2019-05-06 MED ORDER — ACETAMINOPHEN 650 MG RE SUPP: 650.0000 mg | Freq: Four times a day (QID) | RECTAL | Status: DC | PRN

## 2019-05-06 MED ORDER — ZOLPIDEM TARTRATE 5 MG PO TABS
5.0000 mg | ORAL_TABLET | Freq: Every evening | ORAL | Status: DC | PRN
  Administered 2019-05-06 – 2019-05-10 (×5): 5 mg via ORAL
  Filled 2019-05-06 (×5): qty 1

## 2019-05-06 MED ORDER — AZITHROMYCIN 250 MG PO TABS
500.0000 mg | ORAL_TABLET | Freq: Every day | ORAL | Status: DC
  Administered 2019-05-07 – 2019-05-08 (×2): 500 mg via ORAL
  Filled 2019-05-06 (×2): qty 2

## 2019-05-06 MED ORDER — VITAMIN K 100 MCG PO TABS: 100.0000 ug | ORAL_TABLET | Freq: Every day | ORAL | Status: DC

## 2019-05-06 MED ORDER — VITAMIN A 10000 UNITS PO CAPS
10000.0000 [IU] | ORAL_CAPSULE | Freq: Every day | ORAL | Status: DC
  Administered 2019-05-06 – 2019-05-11 (×6): 10000 [IU] via ORAL
  Filled 2019-05-06 (×6): qty 1

## 2019-05-06 MED ORDER — ACETAMINOPHEN 325 MG PO TABS
650.0000 mg | ORAL_TABLET | Freq: Four times a day (QID) | ORAL | Status: DC | PRN
  Administered 2019-05-06: 650 mg via ORAL
  Filled 2019-05-06 (×2): qty 2

## 2019-05-06 MED ORDER — SODIUM CHLORIDE 0.9% FLUSH
3.0000 mL | Freq: Once | INTRAVENOUS | Status: AC
  Administered 2019-05-06: 3 mL via INTRAVENOUS

## 2019-05-06 MED ORDER — COLCHICINE 0.6 MG PO TABS
0.6000 mg | ORAL_TABLET | Freq: Two times a day (BID) | ORAL | Status: DC
  Administered 2019-05-06 – 2019-05-08 (×4): 0.6 mg via ORAL
  Filled 2019-05-06 (×4): qty 1

## 2019-05-06 MED ORDER — SODIUM CHLORIDE 0.9 % IV SOLN
500.0000 mg | Freq: Once | INTRAVENOUS | Status: AC
Start: 2019-05-06 — End: 2019-05-06
  Administered 2019-05-06: 500 mg via INTRAVENOUS
  Filled 2019-05-06: qty 500

## 2019-05-06 MED ORDER — ENOXAPARIN SODIUM 40 MG/0.4ML ~~LOC~~ SOLN
40.0000 mg | SUBCUTANEOUS | Status: DC
  Administered 2019-05-06 – 2019-05-09 (×4): 40 mg via SUBCUTANEOUS
  Filled 2019-05-06 (×4): qty 0.4

## 2019-05-06 MED ORDER — SODIUM CHLORIDE 0.9 % IV SOLN
1.0000 g | Freq: Once | INTRAVENOUS | Status: AC
  Administered 2019-05-06: 1 g via INTRAVENOUS
  Filled 2019-05-06: qty 10

## 2019-05-06 MED ORDER — SODIUM CHLORIDE 1 G PO TABS
1.0000 g | ORAL_TABLET | Freq: Two times a day (BID) | ORAL | Status: AC
  Administered 2019-05-06 – 2019-05-07 (×3): 1 g via ORAL
  Filled 2019-05-06 (×3): qty 1

## 2019-05-06 MED ORDER — SODIUM CHLORIDE 0.9 % IV SOLN
1.0000 g | INTRAVENOUS | Status: DC
  Administered 2019-05-07: 1 g via INTRAVENOUS
  Filled 2019-05-06: qty 1
  Filled 2019-05-06: qty 10

## 2019-05-06 NOTE — ED Provider Notes (Signed)
[facility] EMERGENCY DEPARTMENT Provider Note   CSN: [id number] Arrival date & time: 05/06/19  1157     History Chief Complaint  Patient presents with  . Chest Pain  . Shortness of Breath    Victoria Stokes is a 64 y.o. female hx of MGUS, hyponatremia who presented with cough, fever, persistent hyponatremia.  Patient states that she has not been feeling well for several weeks.  She came to the ED about a week ago was diagnosed with hyponatremia.  Patient's sodium was 122 at that time her CT showed pleural effusion but no mass.  She was given fluids and offered admission but decided to go home instead.  Her sodium went up to 125 on discharge .  Patient states that she continues to not feel well.  She has been having productive cough as well as shortness of breath.  Also has persistent fever. She was febrile 101.8 this morning.  She states that she has been drinking a lot of water and eating salt tablets.  Patient was prescribed amoxicillin this morning by her doctor.  The history is provided by the patient.       Past Medical History:  Diagnosis Date  . Low sodium levels    Hx  . Seizures ([place]) 10/29/2016    Patient Active Problem List   Diagnosis Date Noted  . Hyponatremia 05/06/2019  . Multiple myeloma not having achieved remission ([place]) 11/27/2016  . Seizures ([place]) 10/29/2016  . Right knee pain 04/14/2014  . Idiopathic scoliosis 02/24/2014    Past Surgical History:  Procedure Laterality Date  . CESAREAN SECTION       OB History   No obstetric history on file.     Family History  Problem Relation Age of Onset  . Breast cancer Mother   . Pancreatic cancer Father   . Seizures Neg Hx     Social History   Tobacco Use  . Smoking status: Never Smoker  . Smokeless tobacco: Never Used  Substance Use Topics  . Alcohol use: Yes    Alcohol/week: 0.0 standard drinks    Comment: 2 drinks per month  . Drug use: No    Home Medications Prior to  Admission medications   Medication Sig Start Date End Date Taking? Authorizing Provider  ALPRAZolam Duanne Moron) 0.5 MG tablet Take 2 tablets approximately 45 minutes prior to the MRI study, take a third tablet if needed. Patient not taking: Reported on 04/30/2019 12/17/16   Kathrynn Ducking, MD  B Complex Vitamins (VITAMIN B-COMPLEX PO) Take 1 tablet by mouth daily.     [provider]  Cholecalciferol (VITAMIN D PO) Take 5,000 Units by mouth daily.     [provider]  CHOLINE PO Take 1 tablet by mouth daily.     [provider]  LORazepam (ATIVAN) 0.5 MG tablet Take 1 tablet (0.5 mg total) by mouth every 4 (four) hours as needed for anxiety. Patient not taking: Reported on 12/10/2016 11/27/16   Ardath Sax, MD  Melatonin 10 MG TABS Take 10 mg by mouth at bedtime.     [provider]  valACYclovir (VALTREX) 500 MG tablet Take 500 mg by mouth 2 (two) times daily as needed. Cold sores 03/11/19   [provider]  VITAMIN A PO Take 1 tablet by mouth daily.     [provider]  VITAMIN K PO Take 1 tablet by mouth daily.     [provider]  zolpidem Lorrin Mais)  5 MG tablet Take 5 mg by mouth as needed for sleep.    [provider]    Allergies    Patient has no known allergies.  Review of Systems   Review of Systems  Respiratory: Positive for shortness of breath.   Cardiovascular: Positive for chest pain.  All other systems reviewed and are negative.   Physical Exam Updated Vital Signs BP 112/87 (BP Location: Right Arm)   Pulse 97   Temp 98.9 F (37.2 C) (Oral)   Resp 16   Ht '5\' 7"'$  (1.702 m)   Wt 63 kg   SpO2 99%   BMI 21.75 kg/m   Physical Exam Vitals and nursing note reviewed.  Constitutional:      Comments: Chronically ill   HENT:     Head: Normocephalic.  Eyes:     Pupils: Pupils are equal, round, and reactive to light.  Cardiovascular:     Rate and Rhythm: Normal rate and regular rhythm.     Heart  sounds: Normal heart sounds.  Pulmonary:     Effort: Pulmonary effort is normal.     Comments: Crackles bilateral bases  Abdominal:     General: Bowel sounds are normal.     Palpations: Abdomen is soft.  Musculoskeletal:        General: Normal range of motion.     Cervical back: Normal range of motion and neck supple.  Skin:    General: Skin is warm.     Capillary Refill: Capillary refill takes less than 2 seconds.  Neurological:     General: No focal deficit present.     Mental Status: She is alert and oriented to person, place, and time.  Psychiatric:        Mood and Affect: Mood normal.        Behavior: Behavior normal.     ED Results / Procedures / Treatments   Labs (all labs ordered are listed, but only abnormal results are displayed) Labs Reviewed  BASIC METABOLIC PANEL - Abnormal; Notable for the following components:      Result Value   Sodium 125 (*)    Chloride 89 (*)    Glucose, Bld 109 (*)    BUN 7 (*)    Calcium 8.4 (*)    All other components within normal limits  CBC - Abnormal; Notable for the following components:   RBC 3.50 (*)    Hemoglobin 10.3 (*)    HCT 31.0 (*)    All other components within normal limits  TROPONIN I (HIGH SENSITIVITY) - Abnormal; Notable for the following components:   Troponin I (High Sensitivity) 24 (*)    All other components within normal limits  CULTURE, BLOOD (ROUTINE X 2)  CULTURE, BLOOD (ROUTINE X 2)  SARS CORONAVIRUS 2 (TAT 6-24 HRS)  LACTIC ACID, PLASMA  LACTIC ACID, PLASMA  URINALYSIS, ROUTINE W REFLEX MICROSCOPIC  OSMOLALITY, URINE  OSMOLALITY  CREATININE, URINE, RANDOM  HIV ANTIBODY (ROUTINE TESTING W REFLEX)  SEDIMENTATION RATE  C-REACTIVE PROTEIN  RHEUMATOID FACTOR  CYCLIC CITRUL PEPTIDE ANTIBODY, IGG/IGA  HEMOGLOBIN A1C  TROPONIN I (HIGH SENSITIVITY)    EKG None  Radiology DG Chest 2 View  Result Date: 05/06/2019 CLINICAL DATA:  Chest pain.  Fever. EXAM: CHEST - 2 VIEW COMPARISON:  April 30, 2019 FINDINGS: The cardiac silhouette remains enlarged, slightly more prominent the interval. Bilateral pleural effusions remains small but are larger in the interval. There is opacity in the left retrocardiac region. The hila  and mediastinum are normal. No pneumothorax. No nodules or masses. IMPRESSION: 1. The cardiac silhouette appears more prominent the interval. The difference may be technical in nature. 2. Small but increasing effusions. 3. Left retrocardiac opacity could represent atelectasis, pneumonia, or aspiration. Recommend clinical correlation. Recommend short-term follow-up imaging to ensure resolution. Electronically Signed   By: Dorise Bullion III M.D   On: 05/06/2019 12:52    Procedures Procedures (including critical care time)  CRITICAL CARE Performed by: Wandra Arthurs   Total critical care time: 30 minutes  Critical care time was exclusive of separately billable procedures and treating other patients.  Critical care was necessary to treat or prevent imminent or life-threatening deterioration.  Critical care was time spent personally by me on the following activities: development of treatment plan with patient and/or surrogate as well as nursing, discussions with consultants, evaluation of patient's response to treatment, examination of patient, obtaining history from patient or surrogate, ordering and performing treatments and interventions, ordering and review of laboratory studies, ordering and review of radiographic studies, pulse oximetry and re-evaluation of patient's condition.   Medications Ordered in ED Medications  sodium chloride flush (NS) 0.9 % injection 3 mL (has no administration in time range)  cefTRIAXone (ROCEPHIN) 1 g in sodium chloride 0.9 % 100 mL IVPB (has no administration in time range)  azithromycin (ZITHROMAX) 500 mg in sodium chloride 0.9 % 250 mL IVPB (500 mg Intravenous New Bag/Given 05/06/19 1413)  zolpidem (AMBIEN) tablet 5 mg (has no  administration in time range)  melatonin tablet 9 mg (has no administration in time range)  vitamin B-12 (CYANOCOBALAMIN) tablet 100 mcg (has no administration in time range)  cholecalciferol (VITAMIN D3) tablet 5,000 Units (has no administration in time range)  vitamin A capsule 10,000 Units (has no administration in time range)  enoxaparin (LOVENOX) injection 40 mg (has no administration in time range)  sodium chloride tablet 1 g (has no administration in time range)  acetaminophen (TYLENOL) tablet 650 mg (has no administration in time range)    Or  acetaminophen (TYLENOL) suppository 650 mg (has no administration in time range)  cefTRIAXone (ROCEPHIN) 1 g in sodium chloride 0.9 % 100 mL IVPB (has no administration in time range)  azithromycin (ZITHROMAX) tablet 500 mg (has no administration in time range)    ED Course  I have reviewed the triage vital signs and the nursing notes.  Pertinent labs & imaging results that were available during my care of the patient were reviewed by me and considered in my medical decision making (see chart for details).    MDM Rules/Calculators/A&P                      Victoria Stokes is a 64 y.o. female here presenting with hyponatremia, cough, fever.  She has been drinking a lot of water as well as taking salt tablets.  Patient appears euvolemic.  I reviewed the work-up about a week ago.  Her serum osm are low and her urine osm are elevated.  I think patient likely has SIADH or drank too much water.  Will check chemistry and repeat a sepsis work-up.  2:56 PM Chest x-ray showed increased opacity.  Her white blood cell count is 10.  Her sodium is 125 which is unchanged from a week ago.  Her lactate is normal.  Ordered Rocephin and azithromycin.  Hospitalist to admit for persistent hyponatremia and pneumonia.   Final Clinical Impression(s) / ED Diagnoses Final diagnoses:  Hyponatremia  Community acquired pneumonia, unspecified laterality    Rx / DC  Orders ED Discharge Orders    None       Drenda Freeze, MD 05/06/19 [phone]

## 2019-05-06 NOTE — Progress Notes (Signed)
Came back showing small to moderate pericardial effusion on the posterior side of the heart, discussed with on-call cardiologist Dr. Debara Pickett, recommend colchicine and ibuprofen. Cardiology will see patient in the morning.

## 2019-05-06 NOTE — H&P (Signed)
History and Physical    Victoria Stokes:[id number] DOB: 1955-03-13 DOA: 05/06/2019  PCP: Kathyrn Lass, MD   Patient coming from: Home  I have personally briefly reviewed patient's old medical records in [place]  Chief Complaint: Chest pain, SOB  HPI: Victoria Stokes is a 64 y.o. 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  Chest pain has been sharp-like, 5-6 over 10, worsening with lying down and deep breath, and somewhat relieved with sitting up, and she denied any cough.  Told her chart her temperature was 100.5 last week, she has been taking Advil every day, so daughter concerned about her actual fever probably was higher.    She came to ED last week, CT angiogram was negative for PE but mild bilateral pleural effusion.  She went to her PCP this week, was prescribed with amoxicillin, for possible pneumonia, and her PCP also suspected that patient may have " pericarditis".  Meanwhile, during the last few weeks, patient started to feel short of breath associated with exertion, no orthopnea, denies any swelling.  And admitted that she few thirsty all the time and drinks about 1 to 1.5 gallons of fluid every day.  She was told by her PCP to put some electrolyte powder into the water, and she had hyponatremia related seizure 3 years ago in 2018. ED Course: Sodium 125, x-ray shows possible left lower field infiltrates  Review of Systems: As per HPI otherwise 10 point review of systems negative.    Past Medical History:  Diagnosis Date  . Low sodium levels    Hx  . Seizures ([place]) 10/29/2016    Past Surgical History:  Procedure Laterality Date  . CESAREAN SECTION       reports that she has never smoked. She has never used smokeless tobacco. She reports current alcohol use. She reports that she does not use drugs.  No Known Allergies  Family History  Problem Relation Age of Onset  . Breast cancer Mother   . Pancreatic cancer Father   . Seizures Neg Hx      Prior to Admission medications   Medication Sig Start Date End Date Taking? Authorizing Provider  ALPRAZolam Duanne Moron) 0.5 MG tablet Take 2 tablets approximately 45 minutes prior to the MRI study, take a third tablet if needed. Patient not taking: Reported on 04/30/2019 12/17/16   Kathrynn Ducking, MD  B Complex Vitamins (VITAMIN B-COMPLEX PO) Take 1 tablet by mouth daily.     [provider]  Cholecalciferol (VITAMIN D PO) Take 5,000 Units by mouth daily.     [provider]  CHOLINE PO Take 1 tablet by mouth daily.     [provider]  LORazepam (ATIVAN) 0.5 MG tablet Take 1 tablet (0.5 mg total) by mouth every 4 (four) hours as needed for anxiety. Patient not taking: Reported on 12/10/2016 11/27/16   Ardath Sax, MD  Melatonin 10 MG TABS Take 10 mg by mouth at bedtime.     [provider]  valACYclovir (VALTREX) 500 MG tablet Take 500 mg by mouth 2 (two) times daily as needed. Cold sores 03/11/19   [provider]  VITAMIN A PO Take 1 tablet by mouth daily.     [provider]  VITAMIN K PO Take 1 tablet by mouth daily.     [provider]  zolpidem (AMBIEN) 5 MG tablet Take 5 mg by mouth as needed for sleep.    [provider]    Physical Exam: Vitals:   05/06/19 1204  BP: 112/87  Pulse: 97  Resp: 16  Temp: 98.9 F (37.2 C)  TempSrc: Oral  SpO2: 99%    Constitutional: NAD, calm, comfortable Vitals:   05/06/19 1204  BP: 112/87  Pulse: 97  Resp: 16  Temp: 98.9 F (37.2 C)  TempSrc: Oral  SpO2: 99%   Eyes: PERRL, lids and conjunctivae normal ENMT: Mucous membranes are moist. Posterior pharynx clear of any exudate or lesions.Normal dentition.  Neck: normal, supple, no masses, no thyromegaly Respiratory: clear to auscultation bilaterally, no wheezing, no crackles. Normal  respiratory effort. No accessory muscle use.  Cardiovascular: Regular rate and rhythm, no murmurs / rubs / gallops. No extremity edema. 2+ pedal pulses. No carotid bruits.  Abdomen: no tenderness, no masses palpated. No hepatosplenomegaly. Bowel sounds positive.  Musculoskeletal: no clubbing / cyanosis. No joint deformity upper and lower extremities. Good ROM, no contractures. Normal muscle tone.  Skin: no rashes, lesions, ulcers. No induration Neurologic: CN 2-12 grossly intact. Sensation intact, DTR normal. Strength 5/5 in all 4.  Psychiatric: Normal judgment and insight. Alert and oriented x 3. Normal mood.     Labs on Admission: I have personally reviewed following labs and imaging studies  CBC: Recent Labs  Lab 04/30/19 1231 05/06/19 1211  WBC 7.0 6.6  HGB 11.6* 10.3*  HCT 34.0* 31.0*  MCV 87.9 88.6  PLT 353 329   Basic Metabolic Panel: Recent Labs  Lab 04/30/19 1231 04/30/19 2044 05/06/19 1211  NA 122* 124* 125*  K 4.6 4.2 4.4  CL 87* 91* 89*  CO2 22 20* 23  GLUCOSE 109* 108* 109*  BUN 7* 6* 7*  CREATININE 0.93 0.73 0.69  CALCIUM 8.8* 8.5* 8.4*   GFR: Estimated Creatinine Clearance: 70.9 mL/min (by C-G formula based on SCr of 0.69 mg/dL). Liver Function Tests: Recent Labs  Lab 04/30/19 1231  AST 27  ALT 18  ALKPHOS 59  BILITOT 1.0  PROT 7.0  ALBUMIN 3.3*   Recent Labs  Lab 04/30/19 1231  LIPASE 22   No results for input(s): AMMONIA in the last 168 hours. Coagulation Profile: No results for input(s): INR, PROTIME in the last 168 hours. Cardiac Enzymes: No results for input(s): CKTOTAL, CKMB, CKMBINDEX, TROPONINI in the last 168 hours. BNP (last 3 results) No results for input(s): PROBNP in the last 8760 hours. HbA1C: No results for input(s): HGBA1C in the last 72 hours. CBG: No results for input(s): GLUCAP in the last 168 hours. Lipid Profile: No results for input(s): CHOL, HDL, LDLCALC, TRIG, CHOLHDL, LDLDIRECT in the last 72 hours. Thyroid  Function Tests: No results for input(s): TSH, T4TOTAL, FREET4, T3FREE, THYROIDAB in the last 72 hours. Anemia Panel: No results for input(s): VITAMINB12, FOLATE, FERRITIN, TIBC, IRON, RETICCTPCT in the last 72 hours. Urine analysis:    Component Value Date/Time   COLORURINE YELLOW 04/30/2019 1522   APPEARANCEUR CLEAR 04/30/2019 1522   LABSPEC 1.011 04/30/2019 1522   PHURINE 6.0 04/30/2019 1522   GLUCOSEU NEGATIVE 04/30/2019 1522   HGBUR NEGATIVE 04/30/2019 1522   BILIRUBINUR NEGATIVE 04/30/2019 1522   KETONESUR 20 (A) 04/30/2019 1522   PROTEINUR NEGATIVE 04/30/2019 1522   NITRITE NEGATIVE 04/30/2019 1522   LEUKOCYTESUR NEGATIVE 04/30/2019 1522    Radiological Exams on Admission: DG Chest 2 View  Result Date: 05/06/2019 CLINICAL DATA:  Chest pain.  Fever.  EXAM: CHEST - 2 VIEW COMPARISON:  April 30, 2019 FINDINGS: The cardiac silhouette remains enlarged, slightly more prominent the interval. Bilateral pleural effusions remains small but are larger in the interval. There is opacity in the left retrocardiac region. The hila and mediastinum are normal. No pneumothorax. No nodules or masses. IMPRESSION: 1. The cardiac silhouette appears more prominent the interval. The difference may be technical in nature. 2. Small but increasing effusions. 3. Left retrocardiac opacity could represent atelectasis, pneumonia, or aspiration. Recommend clinical correlation. Recommend short-term follow-up imaging to ensure resolution. Electronically Signed   By: Dorise Bullion III M.D   On: 05/06/2019 12:52    EKG: Independently reviewed.  Sinus rhythm, no acute ST-T changes  Assessment/Plan Active Problems:   * No active hospital problems. *  Chest pain, rule out pericarditis Her chest pain feature compatible with pleuritic chest pain, troponin 2 sets negative, will send echocardiogram. Patient reported last summer and autumn she had to prolonged episodes of migrating joint swelling and pain in her fingers  wrist, elbow and knees and low grade fever but no rash.  She went to her PCP who did some work-up, which showed she had extremely high CRP more than 200.  Her PCP recommended her to go see a rheumatologist, which she has not able to set up. Recent CRP and ESR, ANA, CCP and rheumatoid factor, and patient can follow-up with her rheumatologist as outpatient.  Acute on Chronic hyponatremia, Likely related to water [place] mania, work-up was done in the ED about 1 week ago. Sodium tablets, restriction 1800 mL/day  Exertional dyspnea With new onset of bilateral pleural effusion, will order echocardiogram to rule out CHF and/or pericardial effusion.  MGUS F/U outpatient oncologist at [facility].  Her last visit was last month, and she was told her condition stable.  She was never on any chemotherapy drugs.  CAP Was on amoxicillin as her PCP, will switch to ceftriaxone and Zithromax for now.   DVT prophylaxis: Lovenox Code Status: Full code Family Communication: Daughter at bedside, all questions answered at my best knowledge Disposition Plan: Probably can be discharged in next 24 hours if sodium level remained stable and echocardiogram done. Consults called: None Admission status: Telemetry observation   Lequita Halt MD [facility] Pager [phone]    05/06/2019, 1:58 PM

## 2019-05-06 NOTE — ED Triage Notes (Signed)
Pt arrives pov with reports of chest pain and weakness. Reports decrease in ability to perform adl's due to shob and weakness. Pt speaking in complete sentences. Pt also endorses temp of 101 pta and took tylenol at approx 0940.

## 2019-05-06 NOTE — Progress Notes (Addendum)
CRITICAL VALUE ALERT  Critical Value:  Serum osmolality: 248  Date & Time Notied:  05/06/2019  2226  Provider Notified: M. Sharlet Salina  Orders Received/Actions taken: awaiting response/ no new orders

## 2019-05-06 NOTE — Progress Notes (Signed)
  Echocardiogram 2D Echocardiogram has been performed.  Victoria Stokes 05/06/2019, 4:18 PM

## 2019-05-06 NOTE — ED Notes (Signed)
[phone] call daughter when in room.

## 2019-05-07 ENCOUNTER — Other Ambulatory Visit: Payer: Self-pay

## 2019-05-07 ENCOUNTER — Encounter (HOSPITAL_COMMUNITY): Payer: Self-pay | Admitting: Internal Medicine

## 2019-05-07 DIAGNOSIS — I318 Other specified diseases of pericardium: Secondary | ICD-10-CM | POA: Diagnosis not present

## 2019-05-07 DIAGNOSIS — J189 Pneumonia, unspecified organism: Secondary | ICD-10-CM | POA: Diagnosis not present

## 2019-05-07 DIAGNOSIS — I313 Pericardial effusion (noninflammatory): Secondary | ICD-10-CM | POA: Diagnosis not present

## 2019-05-07 DIAGNOSIS — I3139 Other pericardial effusion (noninflammatory): Secondary | ICD-10-CM

## 2019-05-07 DIAGNOSIS — E871 Hypo-osmolality and hyponatremia: Secondary | ICD-10-CM | POA: Diagnosis not present

## 2019-05-07 LAB — BASIC METABOLIC PANEL
Anion gap: 9 (ref 5–15)
BUN: 7 mg/dL — ABNORMAL LOW (ref 8–23)
CO2: 25 mmol/L (ref 22–32)
Calcium: 8.2 mg/dL — ABNORMAL LOW (ref 8.9–10.3)
Chloride: 94 mmol/L — ABNORMAL LOW (ref 98–111)
Creatinine, Ser: 0.67 mg/dL (ref 0.44–1.00)
GFR calc Af Amer: >60 mL/min (ref >60)
GFR calc non Af Amer: >60 mL/min (ref >60)
Glucose, Bld: 92 mg/dL (ref 70–99)
Potassium: 3.9 mmol/L (ref 3.5–5.1)
Sodium: 128 mmol/L — ABNORMAL LOW (ref 135–145)

## 2019-05-07 LAB — ANA: Anti Nuclear Antibody (ANA): POSITIVE — AB

## 2019-05-07 LAB — RHEUMATOID FACTOR: Rheumatoid fact SerPl-aCnc: 14.9 IU/mL — ABNORMAL HIGH (ref 0.0–13.9)

## 2019-05-07 MED ORDER — HYDROCODONE-ACETAMINOPHEN 5-325 MG PO TABS
1.0000 | ORAL_TABLET | Freq: Four times a day (QID) | ORAL | Status: DC | PRN
  Administered 2019-05-08 – 2019-05-11 (×9): 1 via ORAL
  Filled 2019-05-07 (×9): qty 1

## 2019-05-07 MED ORDER — PANTOPRAZOLE SODIUM 20 MG PO TBEC
20.0000 mg | DELAYED_RELEASE_TABLET | Freq: Every day | ORAL | Status: DC
  Administered 2019-05-07 – 2019-05-10 (×4): 20 mg via ORAL
  Filled 2019-05-07 (×4): qty 1

## 2019-05-07 NOTE — Progress Notes (Signed)
Progress Note    Victoria Stokes  MWN:[id number] DOB: 1955-08-29  DOA: 05/06/2019 PCP: Kathyrn Lass, MD    Brief Narrative:     Medical records reviewed and are as summarized below:  Victoria Stokes is an 64 y.o. 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  Chest pain has been sharp-like, 5-6 over 10, worsening with lying down and deep breath, and somewhat relieved with sitting up, and she denied any cough.  Told her chart her temperature was 100.5 last week, she has been taking Advil every day, so daughter concerned about her actual fever probably was higher.   She came to ED last week, CT angiogram was negative for PE but mild bilateral pleural effusion.  She went to her PCP this week, was prescribed with amoxicillin, for possible pneumonia, and her PCP also suspected that patient may have " pericarditis".  Meanwhile, during the last few weeks, patient started to feel short of breath associated with exertion, no orthopnea, denies any swelling.  And admitted that she few thirsty all the time and drinks about 1 to 1.5 gallons of fluid every day.   Assessment/Plan:   Active Problems:   Hyponatremia   Community acquired pneumonia   Pericardial effusion     Chest pain/exertional dyspnea with pericardial effusion -troponin  flat -Echo: EF of 60 to 65% with no regional wall motion abnormalities.  No evidence of tamponade but there is a small to moderate pericardial effusion in the posterior and lateral to the left ventricle and anterior to the right ventricle -cardiology consult -Patient does have some mild pulmonary edema it appears at the base of her lung, will continue to monitor  Possible autoimmune disorder -Patient reported last summer and autumn she had to prolonged episodes of migrating joint swelling and pain in her fingers wrist,  elbow and knees and low grade fever but no rash.   -She went to Dr. Addison Lank who did some work-up, which showed she had extremely high CRP more than 200.   -Her PCP recommended her to go see a rheumatologist, which she has not able to set up until April -CRP trending down and ESR normal, ANA positive, CCP is still pending and rheumatoid factor mildly elevated -Denies dry eyes -?  Need to start steroids -Will reach out to Dr. Amil Amen in the a.m. and see if we can get a sooner appointment for patient  Acute on Chronic hyponatremia, -Serum osmolality low, urine osmolality normal, sodium urine: < 10 -Patient does states she drinks a lot of water so could be from water Poto mania  MGUS -Patient F/U outpatient with oncologist at [facility] visit was in October 2020: Dr. [name] is a 64 y.o. female with IgG kappa plasma cell dyscrasia and xrays suspicious for lytic lesions but a PET/CT without any evidence of FDG-avid osseous disease, who presents in follow up. Disease is most consistent with MGUS/early smoldering MM.   When she presented to our clinic, her M-spike had improved, she had a low burden of disease in her marrow, no dysregulation of her free light chains, and minimal proteinuria. She also has no anemia, hypercalcemia, or renal dysfunction. The results of the PET/CT showed there was no evidence of FDG-avid osseous disease, surprisingly, there isn't even uptake around the T4 fracture, making me question the chronicity of this  lesion. Her case was discussed with Drs. Hopkins and [place] who presented her case at the [facility]ine Metastasis Tumor Board. Following this, their recommendation was prospective monitoring. 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  In February, she had a small progression in M-spike and new dysregulation of light chains which is consistent with MGUS disease progression. This may have been as a result of a change in her lifestyle.  Following resuming a strict keto diet, her labs improved. This past Summer she suffered some type of viral insult creating significant inflammatory stressors in her body, her platelet elevation is likely still a result of reactivity. I am pleased to see her Mspike is improved but FLC slightly disregulated, possibly as a result. Will CTM and encourage healthy lifestyle.   CAP -Was started on amoxicillin as an outpatient -Changed over to ceftriaxone/azithromycin -Check procalcitonin  Abdominal distention -Bowel regimen -Abdominal x-ray if continues   Family Communication/Anticipated D/C date and plan/Code Status    Code Status: Full Code.  Family Communication: Daughter at bedside Disposition Plan: Pending improvement of symptoms and ability to get patient a sooner appointment with rheumatology   Medical Consultants:    Cardiology     Subjective:   Multiple complaints but most complaining of chest discomfort: Patient states left-sided initially but now more right-sided.  She does not think she improves with position  Objective:    Vitals:   05/06/19 1642 05/07/19 0013 05/07/19 0459 05/07/19 1000  BP: 123/84 108/78 113/75 117/74  Pulse: 100 84 80 86  Resp: '16 17 18 20  ' Temp: 98.9 F (37.2 C) 97.8 F (36.6 C) 98.4 F (36.9 C)   TempSrc: Oral Oral Oral Oral  SpO2: 95% 95% 93% 95%  Weight:      Height:        Intake/Output Summary (Last 24 hours) at 05/07/2019 1702 Last data filed at 05/06/2019 1745 Gross per 24 hour  Intake 250 ml  Output --  Net 250 ml   Filed Weights   05/06/19 1403  Weight: 63 kg    Exam: Appears more comfortable this afternoon with proper pain treatment Regular rate and rhythm Crackles at bases of lungs, no wheezing No lower extremity edema   Data Reviewed:   I have personally reviewed following labs and imaging studies:  Labs: Labs show the following:   Basic Metabolic Panel: Recent Labs  Lab 04/30/19 2044 04/30/19 2044  05/06/19 1211 05/07/19 0548  NA 124*  --  125* 128*  K 4.2   < > 4.4 3.9  CL 91*  --  89* 94*  CO2 20*  --  23 25  GLUCOSE 108*  --  109* 92  BUN 6*  --  7* 7*  CREATININE 0.73  --  0.69 0.67  CALCIUM 8.5*  --  8.4* 8.2*   < > = values in this interval not displayed.   GFR Estimated Creatinine Clearance: 70.9 mL/min (by C-G formula based on SCr of 0.67 mg/dL). Liver Function Tests: No results for input(s): AST, ALT, ALKPHOS, BILITOT, PROT, ALBUMIN in the last 168 hours. No results for input(s): LIPASE, AMYLASE in the last 168 hours. No results for input(s): AMMONIA in the last 168 hours. Coagulation profile No results for input(s): INR, PROTIME in the last 168 hours.  CBC: Recent Labs  Lab 05/06/19 1211  WBC 6.6  HGB 10.3*  HCT 31.0*  MCV 88.6  PLT 387   Cardiac Enzymes: No results for input(s): CKTOTAL, CKMB, CKMBINDEX,  TROPONINI in the last 168 hours. BNP (last 3 results) No results for input(s): PROBNP in the last 8760 hours. CBG: No results for input(s): GLUCAP in the last 168 hours. D-Dimer: No results for input(s): DDIMER in the last 72 hours. Hgb A1c: Recent Labs    05/06/19 1456  HGBA1C 5.5   Lipid Profile: No results for input(s): CHOL, HDL, LDLCALC, TRIG, CHOLHDL, LDLDIRECT in the last 72 hours. Thyroid function studies: No results for input(s): TSH, T4TOTAL, T3FREE, THYROIDAB in the last 72 hours.  Invalid input(s): FREET3 Anemia work up: No results for input(s): VITAMINB12, FOLATE, FERRITIN, TIBC, IRON, RETICCTPCT in the last 72 hours. Sepsis Labs: Recent Labs  Lab 05/06/19 1211 05/06/19 1427 05/06/19 1654  WBC 6.6  --   --   LATICACIDVEN  --  1.4 1.2    Microbiology Recent Results (from the past 240 hour(s))  Blood culture (routine x 2)     Status: None   Collection Time: 04/30/19  2:53 PM   Specimen: BLOOD  Result Value Ref Range Status   Specimen Description BLOOD SITE NOT SPECIFIED  Final   Special Requests   Final    BOTTLES  DRAWN AEROBIC AND ANAEROBIC Blood Culture adequate volume   Culture NO GROWTH 5 DAYS  Final   Report Status 05/05/2019 FINAL  Final  Respiratory Panel by RT PCR (Flu A&B, Covid) - Urine, Clean Catch     Status: None   Collection Time: 04/30/19  3:07 PM   Specimen: Urine, Clean Catch  Result Value Ref Range Status   SARS Coronavirus 2 by RT PCR NEGATIVE NEGATIVE Final    Comment: (NOTE) SARS-CoV-2 target nucleic acids are NOT DETECTED. The SARS-CoV-2 RNA is generally detectable in upper respiratoy specimens during the acute phase of infection. The lowest concentration of SARS-CoV-2 viral copies this assay can detect is 131 copies/mL. A negative result does not preclude SARS-Cov-2 infection and should not be used as the sole basis for treatment or other patient management decisions. A negative result may occur with  improper specimen collection/handling, submission of specimen other than nasopharyngeal swab, presence of viral mutation(s) within the areas targeted by this assay, and inadequate number of viral copies (<131 copies/mL). A negative result must be combined with clinical observations, patient history, and epidemiological information. The expected result is Negative. Fact Sheet for Patients:  PinkCheek.be Fact Sheet for Healthcare Providers:  GravelBags.it This test is not yet ap proved or cleared by the [place] FDA and  has been authorized for detection and/or diagnosis of SARS-CoV-2 by FDA under an Emergency Use Authorization (EUA). This EUA will remain  in effect (meaning this test can be used) for the duration of the COVID-19 declaration under Section 564(b)(1) of the Act, 21 U.S.C. section 360bbb-3(b)(1), unless the authorization is terminated or revoked sooner.    Influenza A by PCR NEGATIVE NEGATIVE Final   Influenza B by PCR NEGATIVE NEGATIVE Final    Comment: (NOTE) The Xpert Xpress SARS-CoV-2/FLU/RSV  assay is intended as an aid in  the diagnosis of influenza from Nasopharyngeal swab specimens and  should not be used as a sole basis for treatment. Nasal washings and  aspirates are unacceptable for Xpert Xpress SARS-CoV-2/FLU/RSV  testing. Fact Sheet for Patients: PinkCheek.be Fact Sheet for Healthcare Providers: GravelBags.it This test is not yet approved or cleared by the [place] FDA and  has been authorized for detection and/or diagnosis of SARS-CoV-2 by  FDA under an Emergency Use Authorization (EUA). This EUA will remain  in effect (meaning this test can be used) for the duration of the  Covid-19 declaration under Section 564(b)(1) of the Act, 21  U.S.C. section 360bbb-3(b)(1), unless the authorization is  terminated or revoked. Performed at [facility], [place] [address] [place], [place] [address]   Blood culture (routine x 2)     Status: None   Collection Time: 04/30/19  4:16 PM   Specimen: BLOOD  Result Value Ref Range Status   Specimen Description BLOOD LEFT ANTECUBITAL  Final   Special Requests   Final    BOTTLES DRAWN AEROBIC AND ANAEROBIC Blood Culture results may not be optimal due to an inadequate volume of blood received in culture bottles   Culture NO GROWTH 5 DAYS  Final   Report Status 05/05/2019 FINAL  Final  SARS CORONAVIRUS 2 (TAT 6-24 HRS)     Status: None   Collection Time: 05/06/19  1:39 PM  Result Value Ref Range Status   SARS Coronavirus 2 NEGATIVE NEGATIVE Final    Comment: (NOTE) SARS-CoV-2 target nucleic acids are NOT DETECTED. The SARS-CoV-2 RNA is generally detectable in upper and lower respiratory specimens during the acute phase of infection. Negative results do not preclude SARS-CoV-2 infection, do not rule out co-infections with other pathogens, and should not be used as the sole basis for treatment or other patient management decisions. Negative results must be combined  with clinical observations, patient history, and epidemiological information. The expected result is Negative. Fact Sheet for Patients: SugarRoll.be Fact Sheet for Healthcare Providers: https://www.woods-mathews.com/ This test is not yet approved or cleared by the [place] FDA and  has been authorized for detection and/or diagnosis of SARS-CoV-2 by FDA under an Emergency Use Authorization (EUA). This EUA will remain  in effect (meaning this test can be used) for the duration of the COVID-19 declaration under Section 56 4(b)(1) of the Act, 21 U.S.C. section 360bbb-3(b)(1), unless the authorization is terminated or revoked sooner. Performed at [facility], [place] [address], [place], [place] [address]   Blood culture (routine x 2)     Status: None (Preliminary result)   Collection Time: 05/06/19  2:14 PM   Specimen: BLOOD  Result Value Ref Range Status   Specimen Description BLOOD RIGHT ANTECUBITAL  Final   Special Requests   Final    BOTTLES DRAWN AEROBIC ONLY Blood Culture results may not be optimal due to an inadequate volume of blood received in culture bottles   Culture   Final    NO GROWTH < 24 HOURS Performed at [facility], [place] [address] [place], [place] [address]    Report Status PENDING  Incomplete  Blood culture (routine x 2)     Status: None (Preliminary result)   Collection Time: 05/06/19  2:17 PM   Specimen: BLOOD  Result Value Ref Range Status   Specimen Description BLOOD LEFT ANTECUBITAL  Final   Special Requests   Final    BOTTLES DRAWN AEROBIC AND ANAEROBIC Blood Culture results may not be optimal due to an inadequate volume of blood received in culture bottles   Culture   Final    NO GROWTH < 24 HOURS Performed at [facility], [place] [address] [place], [place] [address]    Report Status PENDING  Incomplete    Procedures and diagnostic studies:  DG Chest 2 View  Result Date:  05/06/2019 CLINICAL DATA:  Chest pain.  Fever. EXAM: CHEST - 2 VIEW COMPARISON:  April 30, 2019  FINDINGS: The cardiac silhouette remains enlarged, slightly more prominent the interval. Bilateral pleural effusions remains small but are larger in the interval. There is opacity in the left retrocardiac region. The hila and mediastinum are normal. No pneumothorax. No nodules or masses. IMPRESSION: 1. The cardiac silhouette appears more prominent the interval. The difference may be technical in nature. 2. Small but increasing effusions. 3. Left retrocardiac opacity could represent atelectasis, pneumonia, or aspiration. Recommend clinical correlation. Recommend short-term follow-up imaging to ensure resolution. Electronically Signed   By: Dorise Bullion III M.D   On: 05/06/2019 12:52   ECHOCARDIOGRAM COMPLETE  Result Date: 05/06/2019    ECHOCARDIOGRAM REPORT   Patient Name:   Victoria Stokes Date of Exam: 05/06/2019 Medical Rec #:  [id number]   Height:       67.0 in Accession #:    [id number]  Weight:       138.9 lb Date of Birth:  1955-05-29    BSA:          1.732 m Patient Age:    13 years    BP:           114/76 mmHg Patient Gender: F           HR:           91 bpm. Exam Location:  Inpatient Procedure: 2D Echo, Cardiac Doppler and Color Doppler Indications:    R07.9* Chest pain, unspecified; I31.3 Pericardial effusion  History:        Patient has no prior history of Echocardiogram examinations.                 Seizures.  Sonographer:    Jonelle Sidle Dance Referring Phys: [id number] [place]  1. Left ventricular ejection fraction, by estimation, is 60 to 65%. The left ventricle has normal function. The left ventricle has no regional wall motion abnormalities. Left ventricular diastolic parameters were normal.  2. Right ventricular systolic function is normal. The right ventricular size is normal. There is normal pulmonary artery systolic pressure.  3. No evidence of tamponade . small to moderate. The pericardial  effusion is posterior and lateral to the left ventricle and anterior to the right ventricle.  4. The mitral valve is normal in structure. Trivial mitral valve regurgitation. No evidence of mitral stenosis.  5. The aortic valve is tricuspid. Aortic valve regurgitation is trivial. Mild to moderate aortic valve sclerosis/calcification is present, without any evidence of aortic stenosis.  6. The inferior vena cava is dilated in size with >50% respiratory variability, suggesting right atrial pressure of 8 mmHg. FINDINGS  Left Ventricle: Left ventricular ejection fraction, by estimation, is 60 to 65%. The left ventricle has normal function. The left ventricle has no regional wall motion abnormalities. The left ventricular internal cavity size was normal in size. There is  no left ventricular hypertrophy. Left ventricular diastolic parameters were normal. Right Ventricle: The right ventricular size is normal. No increase in right ventricular wall thickness. Right ventricular systolic function is normal. There is normal pulmonary artery systolic pressure. The tricuspid regurgitant velocity is 1.77 m/s, and  with an assumed right atrial pressure of 8 mmHg, the estimated right ventricular systolic pressure is 74.2 mmHg. Left Atrium: Left atrial size was normal in size. Right Atrium: Right atrial size was normal in size. Pericardium: No evidence of tamponade. Small to moderate. The pericardial effusion is posterior and lateral to the left ventricle and anterior to the right ventricle. Mitral Valve: The mitral valve is  normal in structure. There is mild thickening of the mitral valve leaflet(s). Normal mobility of the mitral valve leaflets. Trivial mitral valve regurgitation. No evidence of mitral valve stenosis. Tricuspid Valve: The tricuspid valve is normal in structure. Tricuspid valve regurgitation is mild . No evidence of tricuspid stenosis. Aortic Valve: The aortic valve is tricuspid. Aortic valve regurgitation is  trivial. Aortic regurgitation PHT measures 486 msec. Mild to moderate aortic valve sclerosis/calcification is present, without any evidence of aortic stenosis. Pulmonic Valve: The pulmonic valve was normal in structure. Pulmonic valve regurgitation is not visualized. No evidence of pulmonic stenosis. Aorta: The aortic root is normal in size and structure. Venous: The inferior vena cava is dilated in size with greater than 50% respiratory variability, suggesting right atrial pressure of 8 mmHg. IAS/Shunts: No atrial level shunt detected by color flow Doppler.  LEFT VENTRICLE PLAX 2D LVIDd:         4.53 cm  Diastology LVIDs:         2.76 cm  LV e' lateral:   8.49 cm/s LV PW:         1.09 cm  LV E/e' lateral: 8.9 LV IVS:        0.83 cm  LV e' medial:    8.59 cm/s LVOT diam:     2.00 cm  LV E/e' medial:  8.8 LV SV:         67 LV SV Index:   39 LVOT Area:     3.14 cm  RIGHT VENTRICLE             IVC RV Basal diam:  2.56 cm     IVC diam: 2.55 cm RV S prime:     10.40 cm/s TAPSE (M-mode): 1.8 cm LEFT ATRIUM             Index       RIGHT ATRIUM           Index LA diam:        3.40 cm 1.96 cm/m  RA Area:     14.20 cm LA Vol (A2C):   44.6 ml 25.75 ml/m RA Volume:   34.40 ml  19.86 ml/m LA Vol (A4C):   30.8 ml 17.78 ml/m LA Biplane Vol: 38.2 ml 22.06 ml/m  AORTIC VALVE LVOT Vmax:   111.00 cm/s LVOT Vmean:  74.200 cm/s LVOT VTI:    0.213 m AI PHT:      486 msec  AORTA Ao Root diam: 3.40 cm Ao Asc diam:  3.40 cm MITRAL VALVE               TRICUSPID VALVE MV Area (PHT): 3.08 cm    TR Peak grad:   12.5 mmHg MV Decel Time: 246 msec    TR Vmax:        177.00 cm/s MV E velocity: 75.70 cm/s MV A velocity: 65.00 cm/s  SHUNTS MV E/A ratio:  1.16        Systemic VTI:  0.21 m                            Systemic Diam: 2.00 cm Jenkins Rouge MD Electronically signed by Jenkins Rouge MD Signature Date/Time: 05/06/2019/4:31:31 PM    Final     Medications:   . azithromycin  500 mg Oral Daily  . cholecalciferol  5,000 Units Oral Daily   . colchicine  0.6 mg Oral BID  . enoxaparin (LOVENOX) injection  40  mg Subcutaneous Q24H  . ibuprofen  600 mg Oral TID  . melatonin  9 mg Oral QHS  . pantoprazole  20 mg Oral Daily  . sodium chloride  1 g Oral BID WC  . vitamin A  10,000 Units Oral Daily  . vitamin B-12  100 mcg Oral Daily   Continuous Infusions: . cefTRIAXone (ROCEPHIN)  IV 1 g (05/07/19 1513)     LOS: 0 days   Geradine Girt  [facility]   How to contact the [facility] Attending or Consulting provider [place] or covering provider during after hours [place], for this patient?  1. Check the care team in [facility] and look for a) attending/consulting [facility] provider listed and b) the [facility] team listed 2. Log into [email or web address] and use [place]'s universal password to access. If you do not have the password, please contact the hospital operator. 3. Locate the [facility] provider you are looking for under [facility] and page to a number that you can be directly reached. 4. If you still have difficulty reaching the provider, please page the [facility] (Director on Call) for the Hospitalists listed on amion for assistance.  05/07/2019, 5:02 PM

## 2019-05-07 NOTE — Consult Note (Addendum)
Cardiology Consultation:   Patient ID: Victoria Stokes MRN: [id number]; DOB: 11-Sep-1955  Admit date: 05/06/2019 Date of Consult: 05/07/2019  Primary Care Provider: Kathyrn Lass, MD Primary Cardiologist: No primary care provider on file.  Primary Electrophysiologist:  None    Patient Profile:   Victoria Stokes is a 64 y.o. 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 Eliseo Squires.  History of Present Illness:   Victoria Stokes has not been seen by cardiology in the past. No hx of MI, stent, heart failure, arrhythmia, stroke, HTN HLD, DM. She did have som palpitations while going through menopause. Negative for family history. No tobacco, drug, alcohol use. She lives with her husband and is very active.   The patient presented to the ED 04/30/19 for vomiting, fever, and fatigue. She also reported worsening chest pain. Said she had been feeling poorly since January. Hyponatremia with NA 122. CXR with left pleural effusion. CTA chest/abd/pelvis negative for PE but showed trace B/L pleural effusions and trace pericardial effusion. Sodium mildly improved and the pdecided to go home. At follow up with her PCP she said sodium was back up to 130 and she was prescribed abx for possible pneumonia.  The patient presented again to the ED 05/06/19 for fever, cough, chest pain, sob, and general fatigue. The patient had not improved much since the first ED visit. She was taking Tylenol and Advil for intermittent fevers and body ache. She reported productive cough and shortness of breath, worse with exertion. She was febrile to 101.4 as well. Her chest pain was worse with inspiration and positioning. Denied lower leg edema or orthopnea.   In the ED BP 112/87, pulse 97, 98.9 F, RR 16, 99% O2. Labs showed sodium 125, glucose  109, calcium 8.4. Hgb 10.3. Hs troponin 24. CXR showed more prominent cardiac silhouette, increasing pleural effusions, left opacity possible PNA or aspiration. EKG showed NSR, 95 bpm with nonspecific T wave changes. She was given abx in the ED.   On my interview the patient appears comfortable. She is accompanied by her daughter. Denied chest pain given that she had just received pain meds. Earlier in the day she was walking up and down the halls and still feels sob.   Past Medical History:  Diagnosis Date  . Low sodium levels    Hx  . Seizures ([place]) 10/29/2016    Past Surgical History:  Procedure Laterality Date  . CESAREAN SECTION       Home Medications:  Prior to Admission medications   Medication Sig Start Date End Date Taking? Authorizing Provider  acetaminophen (TYLENOL) 500 MG tablet Take 1,000 mg by mouth every 6 (six) hours as needed for mild pain.   Yes [provider]  amoxicillin-clavulanate (AUGMENTIN) 875-125 MG tablet Take 1 tablet by mouth 2 (two) times daily. 05/05/19  Yes [provider]  B Complex Vitamins (VITAMIN B-COMPLEX PO) Take 1 tablet by mouth daily.    Yes [provider]  Cholecalciferol (VITAMIN D PO) Take 5,000 Units by mouth daily.    Yes [provider]  CHOLINE PO Take 1 tablet by mouth daily.    Yes [provider]  Melatonin 10 MG TABS Take 10 mg by mouth at bedtime.    Yes [provider]  valACYclovir (VALTREX) 500 MG tablet  Take 500 mg by mouth 2 (two) times daily as needed. Cold sores 03/11/19  Yes [provider]  VITAMIN A PO Take 1 tablet by mouth daily.    Yes [provider]  VITAMIN K PO Take 1 tablet by mouth daily.    Yes [provider]  zolpidem (AMBIEN) 5 MG tablet Take 5 mg by mouth as needed for sleep.   Yes [provider]  ALPRAZolam Duanne Moron) 0.5 MG tablet Take 2 tablets approximately 45 minutes prior to the MRI study, take a third tablet if  needed. Patient not taking: Reported on 04/30/2019 12/17/16   Kathrynn Ducking, MD  LORazepam (ATIVAN) 0.5 MG tablet Take 1 tablet (0.5 mg total) by mouth every 4 (four) hours as needed for anxiety. Patient not taking: Reported on 12/10/2016 11/27/16   Ardath Sax, MD    Inpatient Medications: Scheduled Meds: . azithromycin  500 mg Oral Daily  . cholecalciferol  5,000 Units Oral Daily  . colchicine  0.6 mg Oral BID  . enoxaparin (LOVENOX) injection  40 mg Subcutaneous Q24H  . ibuprofen  600 mg Oral TID  . melatonin  9 mg Oral QHS  . sodium chloride  1 g Oral BID WC  . vitamin A  10,000 Units Oral Daily  . vitamin B-12  100 mcg Oral Daily   Continuous Infusions: . cefTRIAXone (ROCEPHIN)  IV     PRN Meds: acetaminophen **OR** acetaminophen, HYDROcodone-acetaminophen, zolpidem  Allergies:    Allergies  Allergen Reactions  . Gluten Meal Other (See Comments)    headache    Social History:   Social History   Socioeconomic History  . Marital status: Married    Spouse name: Victoria Stokes  . Number of children: 2  . Years of education: 43  . Highest education level: Not on file  Occupational History  . Occupation: Chief Executive Officer  Tobacco Use  . Smoking status: Never Smoker  . Smokeless tobacco: Never Used  Substance and Sexual Activity  . Alcohol use: Yes    Alcohol/week: 0.0 standard drinks    Comment: 2 drinks per month  . Drug use: No  . Sexual activity: Not on file  Other Topics Concern  . Not on file  Social History Narrative   Lives   Caffeine use: 2 cups per day (coffee)   Right handed    Social Determinants of Health   Financial Resource Strain:   . Difficulty of Paying Living Expenses:   Food Insecurity:   . Worried About Charity fundraiser in the Last Year:   . Arboriculturist in the Last Year:   Transportation Needs:   . Film/video editor (Medical):   Marland Kitchen Lack of Transportation (Non-Medical):   Physical Activity:   . Days of Exercise per Week:   .  Minutes of Exercise per Session:   Stress:   . Feeling of Stress :   Social Connections:   . Frequency of Communication with Friends and Family:   . Frequency of Social Gatherings with Friends and Family:   . Attends Religious Services:   . Active Member of Clubs or Organizations:   . Attends Archivist Meetings:   Marland Kitchen Marital Status:   Intimate Partner Violence:   . Fear of Current or Ex-Partner:   . Emotionally Abused:   Marland Kitchen Physically Abused:   . Sexually Abused:     Family History:   Family History  Problem Relation Age of Onset  . Breast cancer  Mother   . Pancreatic cancer Father   . Seizures Neg Hx      ROS:  Please see the history of present illness.  All other ROS reviewed and negative.     Physical Exam/Data:   Vitals:   05/06/19 1642 05/07/19 0013 05/07/19 0459 05/07/19 1000  BP: 123/84 108/78 113/75 117/74  Pulse: 100 84 80 86  Resp: 16 17 18 20   Temp: 98.9 F (37.2 C) 97.8 F (36.6 C) 98.4 F (36.9 C)   TempSrc: Oral Oral Oral Oral  SpO2: 95% 95% 93% 95%  Weight:      Height:        Intake/Output Summary (Last 24 hours) at 05/07/2019 1306 Last data filed at 05/06/2019 1745 Gross per 24 hour  Intake 250 ml  Output --  Net 250 ml   Last 3 Weights 05/06/2019 04/30/2019 01/10/2017  Weight (lbs) 138 lb 14.2 oz 140 lb 145 lb 1.6 oz  Weight (kg) 63 kg 63.504 kg 65.817 kg     Body mass index is 21.75 kg/m.  General:  Well nourished, well developed, in no acute distress HEENT: normal Lymph: no adenopathy Neck: no JVD Endocrine:  No thryomegaly Vascular: No carotid bruits; FA pulses 2+ bilaterally without bruits  Cardiac:  normal S1, S2; RRR; no murmur  Lungs:  Crackles at bases with diminished breath sounds Abd: soft, nontender, no hepatomegaly  Ext: no edema Musculoskeletal:  No deformities, BUE and BLE strength normal and equal Skin: warm and dry  Neuro:  CNs 2-12 intact, no focal abnormalities noted Psych:  Normal affect   EKG:  The EKG  was personally reviewed and demonstrates:  Sinus with nonspecific t wave changes, 95 bpm. Telemetry:  Telemetry was personally reviewed and demonstrates:    Relevant CV Studies:  Echo 05/06/19 1. Left ventricular ejection fraction, by estimation, is 60 to 65%. The  left ventricle has normal function. The left ventricle has no regional  wall motion abnormalities. Left ventricular diastolic parameters were  normal.  2. Right ventricular systolic function is normal. The right ventricular  size is normal. There is normal pulmonary artery systolic pressure.  3. No evidence of tamponade . small to moderate. The pericardial effusion  is posterior and lateral to the left ventricle and anterior to the right  ventricle.  4. The mitral valve is normal in structure. Trivial mitral valve  regurgitation. No evidence of mitral stenosis.  5. The aortic valve is tricuspid. Aortic valve regurgitation is trivial.  Mild to moderate aortic valve sclerosis/calcification is present, without  any evidence of aortic stenosis.  6. The inferior vena cava is dilated in size with >50% respiratory  variability, suggesting right atrial pressure of 8 mmHg.   Laboratory Data:  High Sensitivity Troponin:   Recent Labs  Lab 04/30/19 1231 04/30/19 1429 05/06/19 1211 05/06/19 1417  TROPONINIHS 8 9 24* 20*     Chemistry Recent Labs  Lab 04/30/19 2044 05/06/19 1211 05/07/19 0548  NA 124* 125* 128*  K 4.2 4.4 3.9  CL 91* 89* 94*  CO2 20* 23 25  GLUCOSE 108* 109* 92  BUN 6* 7* 7*  CREATININE 0.73 0.69 0.67  CALCIUM 8.5* 8.4* 8.2*  GFRNONAA >60 >60 >60  GFRAA >60 >60 >60  ANIONGAP 13 13 9     No results for input(s): PROT, ALBUMIN, AST, ALT, ALKPHOS, BILITOT in the last 168 hours. Hematology Recent Labs  Lab 05/06/19 1211  WBC 6.6  RBC 3.50*  HGB 10.3*  HCT 31.0*  MCV 88.6  MCH 29.4  MCHC 33.2  RDW 12.9  PLT 387   BNPNo results for input(s): BNP, PROBNP in the last 168 hours.  DDimer No  results for input(s): DDIMER in the last 168 hours.   Radiology/Studies:  DG Chest 2 View  Result Date: 05/06/2019 CLINICAL DATA:  Chest pain.  Fever. EXAM: CHEST - 2 VIEW COMPARISON:  April 30, 2019 FINDINGS: The cardiac silhouette remains enlarged, slightly more prominent the interval. Bilateral pleural effusions remains small but are larger in the interval. There is opacity in the left retrocardiac region. The hila and mediastinum are normal. No pneumothorax. No nodules or masses. IMPRESSION: 1. The cardiac silhouette appears more prominent the interval. The difference may be technical in nature. 2. Small but increasing effusions. 3. Left retrocardiac opacity could represent atelectasis, pneumonia, or aspiration. Recommend clinical correlation. Recommend short-term follow-up imaging to ensure resolution. Electronically Signed   By: Dorise Bullion III M.D   On: 05/06/2019 12:52   ECHOCARDIOGRAM COMPLETE  Result Date: 05/06/2019    ECHOCARDIOGRAM REPORT   Patient Name:   Victoria Stokes Date of Exam: 05/06/2019 Medical Rec #:  [id number]   Height:       67.0 in Accession #:    [id number]  Weight:       138.9 lb Date of Birth:  1955/09/06    BSA:          1.732 m Patient Age:    70 years    BP:           114/76 mmHg Patient Gender: F           HR:           91 bpm. Exam Location:  Inpatient Procedure: 2D Echo, Cardiac Doppler and Color Doppler Indications:    R07.9* Chest pain, unspecified; I31.3 Pericardial effusion  History:        Patient has no prior history of Echocardiogram examinations.                 Seizures.  Sonographer:    Jonelle Sidle Dance Referring Phys: [id number] [place]  1. Left ventricular ejection fraction, by estimation, is 60 to 65%. The left ventricle has normal function. The left ventricle has no regional wall motion abnormalities. Left ventricular diastolic parameters were normal.  2. Right ventricular systolic function is normal. The right ventricular size is normal. There is  normal pulmonary artery systolic pressure.  3. No evidence of tamponade . small to moderate. The pericardial effusion is posterior and lateral to the left ventricle and anterior to the right ventricle.  4. The mitral valve is normal in structure. Trivial mitral valve regurgitation. No evidence of mitral stenosis.  5. The aortic valve is tricuspid. Aortic valve regurgitation is trivial. Mild to moderate aortic valve sclerosis/calcification is present, without any evidence of aortic stenosis.  6. The inferior vena cava is dilated in size with >50% respiratory variability, suggesting right atrial pressure of 8 mmHg. FINDINGS  Left Ventricle: Left ventricular ejection fraction, by estimation, is 60 to 65%. The left ventricle has normal function. The left ventricle has no regional wall motion abnormalities. The left ventricular internal cavity size was normal in size. There is  no left ventricular hypertrophy. Left ventricular diastolic parameters were normal. Right Ventricle: The right ventricular size is normal. No increase in right ventricular wall thickness. Right ventricular systolic function is normal. There is normal pulmonary artery systolic pressure. The tricuspid regurgitant velocity is 1.77 m/s,  and  with an assumed right atrial pressure of 8 mmHg, the estimated right ventricular systolic pressure is 123XX123 mmHg. Left Atrium: Left atrial size was normal in size. Right Atrium: Right atrial size was normal in size. Pericardium: No evidence of tamponade. Small to moderate. The pericardial effusion is posterior and lateral to the left ventricle and anterior to the right ventricle. Mitral Valve: The mitral valve is normal in structure. There is mild thickening of the mitral valve leaflet(s). Normal mobility of the mitral valve leaflets. Trivial mitral valve regurgitation. No evidence of mitral valve stenosis. Tricuspid Valve: The tricuspid valve is normal in structure. Tricuspid valve regurgitation is mild . No  evidence of tricuspid stenosis. Aortic Valve: The aortic valve is tricuspid. Aortic valve regurgitation is trivial. Aortic regurgitation PHT measures 486 msec. Mild to moderate aortic valve sclerosis/calcification is present, without any evidence of aortic stenosis. Pulmonic Valve: The pulmonic valve was normal in structure. Pulmonic valve regurgitation is not visualized. No evidence of pulmonic stenosis. Aorta: The aortic root is normal in size and structure. Venous: The inferior vena cava is dilated in size with greater than 50% respiratory variability, suggesting right atrial pressure of 8 mmHg. IAS/Shunts: No atrial level shunt detected by color flow Doppler.  LEFT VENTRICLE PLAX 2D LVIDd:         4.53 cm  Diastology LVIDs:         2.76 cm  LV e' lateral:   8.49 cm/s LV PW:         1.09 cm  LV E/e' lateral: 8.9 LV IVS:        0.83 cm  LV e' medial:    8.59 cm/s LVOT diam:     2.00 cm  LV E/e' medial:  8.8 LV SV:         67 LV SV Index:   39 LVOT Area:     3.14 cm  RIGHT VENTRICLE             IVC RV Basal diam:  2.56 cm     IVC diam: 2.55 cm RV S prime:     10.40 cm/s TAPSE (M-mode): 1.8 cm LEFT ATRIUM             Index       RIGHT ATRIUM           Index LA diam:        3.40 cm 1.96 cm/m  RA Area:     14.20 cm LA Vol (A2C):   44.6 ml 25.75 ml/m RA Volume:   34.40 ml  19.86 ml/m LA Vol (A4C):   30.8 ml 17.78 ml/m LA Biplane Vol: 38.2 ml 22.06 ml/m  AORTIC VALVE LVOT Vmax:   111.00 cm/s LVOT Vmean:  74.200 cm/s LVOT VTI:    0.213 m AI PHT:      486 msec  AORTA Ao Root diam: 3.40 cm Ao Asc diam:  3.40 cm MITRAL VALVE               TRICUSPID VALVE MV Area (PHT): 3.08 cm    TR Peak grad:   12.5 mmHg MV Decel Time: 246 msec    TR Vmax:        177.00 cm/s MV E velocity: 75.70 cm/s MV A velocity: 65.00 cm/s  SHUNTS MV E/A ratio:  1.16        Systemic VTI:  0.21 m  Systemic Diam: 2.00 cm Jenkins Rouge MD Electronically signed by Jenkins Rouge MD Signature Date/Time: 05/06/2019/4:31:31 PM     Final    {  Assessment and Plan:   Pericardial effusion/Pericarditis Patient presented with chest pain, fever, sob, and fatigue over the last several weeks. Hs troponin 24 >20. CXR with more prominent cardiac silhouette. EKG with nonspecific T wave changes.  - Echo yesterday showed EF 60-65%, small to moderate pericardial effusion with no evidence of tamponade - small effusion noted on CTA chest 3/18 - CRP elevated at 16.2 and Sed rate normal - Started on colchicine, Ibuprofen. Will start Protonix.  - Monitor for signs of tamponade - can also consider rheumatologic work-up considering arthritic/joint pain and intermittent fevers - Would plan for follow-up echo in 2 weeks or sooner if suspect tamponade  Acute on chronic hyponatremia - related to potomania - sodium tablets and fluid restriction  MGUS - Follows with [facility]  CAP - CXR with possible PNA and B/L pleural effusions - abx per IM - Lactic acid normal   For questions or updates, please contact [facility] Please consult [email or web address] for contact info under     Signed, Cadence Ninfa Meeker, PA-C  05/07/2019 1:06 PM   Patient seen and examined with Cadence Furth PA-C.  Agree as above, with the following exceptions and changes as noted below.Patient endorses pleuritic chest pain, and there is concern for an underlying autoimmune process, with evidence of serositis. Gen: NAD, CV: RRR, no murmurs, no rub, Lungs: clear, Abd: soft, Extrem: Warm, well perfused, no edema, Neuro/Psych: alert and oriented x 3, normal mood and affect. All available labs, radiology testing, previous records reviewed. Agree with ibuprofen and colchicine. If pericardial effusion is autoimmune in etiology, would use prednisone. Agree with attempting to move Rheum appt sooner. Would repeat an echocardiogram in 2 weeks to ensure no change, and I will see her in the office to discuss symptoms. Recommend ibuprofen for 1 month and colchicine for 3 mo, ideally  continued if symptoms persist or CRP/Sed rate remain elevated, so as to avoid development of chronic pericarditis.  Elouise Munroe 05/07/19 6:33 PM

## 2019-05-08 ENCOUNTER — Inpatient Hospital Stay (HOSPITAL_COMMUNITY): Payer: 59

## 2019-05-08 DIAGNOSIS — Z79890 Hormone replacement therapy (postmenopausal): Secondary | ICD-10-CM | POA: Diagnosis not present

## 2019-05-08 DIAGNOSIS — K521 Toxic gastroenteritis and colitis: Secondary | ICD-10-CM | POA: Diagnosis not present

## 2019-05-08 DIAGNOSIS — J90 Pleural effusion, not elsewhere classified: Secondary | ICD-10-CM | POA: Diagnosis not present

## 2019-05-08 DIAGNOSIS — D472 Monoclonal gammopathy: Secondary | ICD-10-CM | POA: Diagnosis present

## 2019-05-08 DIAGNOSIS — D649 Anemia, unspecified: Secondary | ICD-10-CM | POA: Diagnosis present

## 2019-05-08 DIAGNOSIS — T504X5A Adverse effect of drugs affecting uric acid metabolism, initial encounter: Secondary | ICD-10-CM | POA: Diagnosis not present

## 2019-05-08 DIAGNOSIS — I319 Disease of pericardium, unspecified: Secondary | ICD-10-CM | POA: Diagnosis present

## 2019-05-08 DIAGNOSIS — I309 Acute pericarditis, unspecified: Secondary | ICD-10-CM | POA: Diagnosis present

## 2019-05-08 DIAGNOSIS — I318 Other specified diseases of pericardium: Secondary | ICD-10-CM | POA: Diagnosis not present

## 2019-05-08 DIAGNOSIS — Z91018 Allergy to other foods: Secondary | ICD-10-CM | POA: Diagnosis not present

## 2019-05-08 DIAGNOSIS — E871 Hypo-osmolality and hyponatremia: Secondary | ICD-10-CM | POA: Diagnosis not present

## 2019-05-08 DIAGNOSIS — J189 Pneumonia, unspecified organism: Secondary | ICD-10-CM | POA: Diagnosis present

## 2019-05-08 DIAGNOSIS — J9811 Atelectasis: Secondary | ICD-10-CM | POA: Diagnosis not present

## 2019-05-08 DIAGNOSIS — Z20822 Contact with and (suspected) exposure to covid-19: Secondary | ICD-10-CM | POA: Diagnosis present

## 2019-05-08 DIAGNOSIS — I313 Pericardial effusion (noninflammatory): Secondary | ICD-10-CM | POA: Diagnosis not present

## 2019-05-08 DIAGNOSIS — E860 Dehydration: Secondary | ICD-10-CM | POA: Diagnosis present

## 2019-05-08 DIAGNOSIS — R140 Abdominal distension (gaseous): Secondary | ICD-10-CM | POA: Diagnosis not present

## 2019-05-08 DIAGNOSIS — Z79899 Other long term (current) drug therapy: Secondary | ICD-10-CM | POA: Diagnosis not present

## 2019-05-08 DIAGNOSIS — K5900 Constipation, unspecified: Secondary | ICD-10-CM | POA: Diagnosis not present

## 2019-05-08 DIAGNOSIS — Y92230 Patient room in hospital as the place of occurrence of the external cause: Secondary | ICD-10-CM | POA: Diagnosis not present

## 2019-05-08 LAB — CBC WITH DIFFERENTIAL/PLATELET
Abs Immature Granulocytes: 0.03 10*3/uL (ref 0.00–0.07)
Basophils Absolute: 0 10*3/uL (ref 0.0–0.1)
Basophils Relative: 0 %
Eosinophils Absolute: 0 10*3/uL (ref 0.0–0.5)
Eosinophils Relative: 1 %
HCT: 27.8 % — ABNORMAL LOW (ref 36.0–46.0)
Hemoglobin: 9.2 g/dL — ABNORMAL LOW (ref 12.0–15.0)
Immature Granulocytes: 1 %
Lymphocytes Relative: 10 %
Lymphs Abs: 0.3 10*3/uL — ABNORMAL LOW (ref 0.7–4.0)
MCH: 29.5 pg (ref 26.0–34.0)
MCHC: 33.1 g/dL (ref 30.0–36.0)
MCV: 89.1 fL (ref 80.0–100.0)
Monocytes Absolute: 0.2 10*3/uL (ref 0.1–1.0)
Monocytes Relative: 7 %
Neutro Abs: 2.3 10*3/uL (ref 1.7–7.7)
Neutrophils Relative %: 81 %
Platelets: 354 10*3/uL (ref 150–400)
RBC: 3.12 MIL/uL — ABNORMAL LOW (ref 3.87–5.11)
RDW: 13.1 % (ref 11.5–15.5)
WBC: 2.9 10*3/uL — ABNORMAL LOW (ref 4.0–10.5)
nRBC: 0 % (ref 0.0–0.2)

## 2019-05-08 LAB — BASIC METABOLIC PANEL
Anion gap: 9 (ref 5–15)
BUN: 8 mg/dL (ref 8–23)
CO2: 25 mmol/L (ref 22–32)
Calcium: 8.2 mg/dL — ABNORMAL LOW (ref 8.9–10.3)
Chloride: 97 mmol/L — ABNORMAL LOW (ref 98–111)
Creatinine, Ser: 0.59 mg/dL (ref 0.44–1.00)
GFR calc Af Amer: >60 mL/min (ref >60)
GFR calc non Af Amer: >60 mL/min (ref >60)
Glucose, Bld: 90 mg/dL (ref 70–99)
Potassium: 4.1 mmol/L (ref 3.5–5.1)
Sodium: 131 mmol/L — ABNORMAL LOW (ref 135–145)

## 2019-05-08 LAB — CYCLIC CITRUL PEPTIDE ANTIBODY, IGG/IGA: CCP Antibodies IgG/IgA: 4 units (ref 0–19)

## 2019-05-08 LAB — OCCULT BLOOD X 1 CARD TO LAB, STOOL: Fecal Occult Bld: NEGATIVE

## 2019-05-08 LAB — PROCALCITONIN: Procalcitonin: <0.1 ng/mL

## 2019-05-08 MED ORDER — COLCHICINE 0.3 MG HALF TABLET
0.3000 mg | ORAL_TABLET | Freq: Two times a day (BID) | ORAL | Status: DC
  Administered 2019-05-08 – 2019-05-11 (×6): 0.3 mg via ORAL
  Filled 2019-05-08 (×7): qty 1

## 2019-05-08 NOTE — Progress Notes (Signed)
Progress Note    Victoria Stokes  [id number] DOB: 01/31/1956  DOA: 05/06/2019 PCP: Kathyrn Lass, MD    Brief Narrative:     Medical records reviewed and are as summarized below:  Victoria Stokes is an 64 y.o. 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  Chest pain has been sharp-like, 5-6 over 10, worsening with lying down and deep breath, and somewhat relieved with sitting up, and she denied any cough.  Told her chart her temperature was 100.5 last week, she has been taking Advil every day, so daughter concerned about her actual fever probably was higher.   She came to ED last week, CT angiogram was negative for PE but mild bilateral pleural effusion.  She went to her PCP this week, was prescribed with amoxicillin, for possible pneumonia, and her PCP also suspected that patient may have " pericarditis".  Meanwhile, during the last few weeks, patient started to feel short of breath associated with exertion, no orthopnea, denies any swelling.  And admitted that she few thirsty all the time and drinks about 1 to 1.5 gallons of fluid every day.   Assessment/Plan:   Active Problems:   Hyponatremia   Community acquired pneumonia   Pericardial effusion   Pericarditis     Chest pain/exertional dyspnea with pericardial effusion: pericarditis -troponin  flat -Echo: EF of 60 to 65% with no regional wall motion abnormalities.  No evidence of tamponade but there is a small to moderate pericardial effusion in the posterior and lateral to the left ventricle and anterior to the right ventricle -cardiology consult appreciated -Patient does have some some small pleural effusions left greater than right: Still does not appear to be enough to get a thoracentesis and laboratory data from -Patient's lung sound better after incentive spirometry -Plan to  continue colchicine for 3 months -Patient will also need echo in 2 weeks to ensure stabilization  Anemia -Unknown source -Patient states she has seen change in her stools -We will do heme test of stools -Outside laboratory data shows that patient was iron deficient when checked last -Recheck iron in the a.m. -Hold ibuprofen until GI bleed ruled out  Possible autoimmune disorder -Patient reported last summer and autumn she had to prolonged episodes of migrating joint swelling and pain in her fingers wrist, elbow and knees and low grade fever but no rash.   -She went to Dr. Addison Lank who did some work-up -Her PCP recommended her to go see a rheumatologist, which she has not able to set up until April: I have called Dr. Melissa Noon office to see if patient can be seen sooner as we suspect her pericardial effusion is related to inflammatory/autoimmune issues -I have reviewed her labs from outside facility including: Labs from 8/20 and 11/20 with a negative rheumatoid factor.  Patient had 3 ANAs drawn all 3 were positive with the patterns of 1: 160--> 1: 1280--> 1: 640  Acute on Chronic hyponatremia, -Serum osmolality low, urine osmolality normal, sodium urine: < 10 -Patient does states she drinks a lot of water so could be from water Poto mania -Sodium up to 131 today -Recheck in a.m.  MGUS -Patient F/U outpatient with oncologist at [facility].   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 female with IgG kappa plasma cell dyscrasia and xrays suspicious for  lytic lesions but a PET/CT without any evidence of FDG-avid osseous disease, who presents in follow up. Disease is most consistent with MGUS/early smoldering MM.   When she presented to our clinic, her M-spike had improved, she had a low burden of disease in her marrow, no dysregulation of her free light chains, and minimal proteinuria. She also has no anemia, hypercalcemia, or renal dysfunction. The results of the PET/CT  showed there was no evidence of FDG-avid osseous disease, surprisingly, there isn't even uptake around the T4 fracture, making me question the chronicity of this lesion. Her case was discussed with Drs. Hopkins and Wells who presented her case at the Spine Metastasis Tumor Board. Following this, their recommendation was prospective monitoring. 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  In February, she had a small progression in M-spike and new dysregulation of light chains which is consistent with MGUS disease progression. This may have been as a result of a change in her lifestyle. Following resuming a strict keto diet, her labs improved. This past Summer she suffered some type of viral insult creating significant inflammatory stressors in her body, her platelet elevation is likely still a result of reactivity. I am pleased to see her Mspike is improved but FLC slightly disregulated, possibly as a result. Will CTM and encourage healthy lifestyle.   CAP -Was started on amoxicillin as an outpatient -Changed over to ceftriaxone/azithromycin -Procalcitonin less than 0.1, doubt infectious process so will DC antibiotics  Abdominal distention -Bowel regimen -Suspect due to constipation   Family Communication/Anticipated D/C date and plan/Code Status    Code Status: Full Code.  Family Communication: Daughter at bedside Disposition Plan: Pending improvement of symptoms and ability to get patient a sooner appointment with rheumatology I have reached out to Dr. Melissa Noon office to see if patient can be worked in sooner.  Await callback  Medical Consultants:    Cardiology     Subjective:   Chest pain still present but less than prior Patient also able to walk further without shortness of breath  Objective:    Vitals:   05/08/19 0007 05/08/19 0457 05/08/19 0515 05/08/19 1149  BP: 120/78  132/81 120/86  Pulse: 89 76 83 80  Resp: 18  19 14   Temp: 98.2 F  (36.8 C) 98.1 F (36.7 C) 98.2 F (36.8 C) 98.8 F (37.1 C)  TempSrc: Oral Oral Oral Oral  SpO2: 92% 93% 96% 96%  Weight:      Height:       No intake or output data in the 24 hours ending 05/08/19 1347 Filed Weights   05/06/19 1403  Weight: 63 kg    Exam: In bed, speaking in full sentences Regular rate and rhythm Lungs fields more clear, no crackles No lower extremity edema Positive bowel sounds, soft, mildly distended   Data Reviewed:   I have personally reviewed following labs and imaging studies:  Labs: Labs show the following:   Basic Metabolic Panel: Recent Labs  Lab 05/06/19 1211 05/06/19 1211 05/07/19 0548 05/08/19 0513  NA 125*  --  128* 131*  K 4.4   < > 3.9 4.1  CL 89*  --  94* 97*  CO2 23  --  25 25  GLUCOSE 109*  --  92 90  BUN 7*  --  7* 8  CREATININE 0.69  --  0.67 0.59  CALCIUM 8.4*  --  8.2* 8.2*   < > = values in this interval not displayed.  GFR Estimated Creatinine Clearance: 70.9 mL/min (by C-G formula based on SCr of 0.59 mg/dL). Liver Function Tests: No results for input(s): AST, ALT, ALKPHOS, BILITOT, PROT, ALBUMIN in the last 168 hours. No results for input(s): LIPASE, AMYLASE in the last 168 hours. No results for input(s): AMMONIA in the last 168 hours. Coagulation profile No results for input(s): INR, PROTIME in the last 168 hours.  CBC: Recent Labs  Lab 05/06/19 1211 05/08/19 0513  WBC 6.6 2.9*  NEUTROABS  --  2.3  HGB 10.3* 9.2*  HCT 31.0* 27.8*  MCV 88.6 89.1  PLT 387 354   Cardiac Enzymes: No results for input(s): CKTOTAL, CKMB, CKMBINDEX, TROPONINI in the last 168 hours. BNP (last 3 results) No results for input(s): PROBNP in the last 8760 hours. CBG: No results for input(s): GLUCAP in the last 168 hours. D-Dimer: No results for input(s): DDIMER in the last 72 hours. Hgb A1c: Recent Labs    05/06/19 1456  HGBA1C 5.5   Lipid Profile: No results for input(s): CHOL, HDL, LDLCALC, TRIG, CHOLHDL, LDLDIRECT  in the last 72 hours. Thyroid function studies: No results for input(s): TSH, T4TOTAL, T3FREE, THYROIDAB in the last 72 hours.  Invalid input(s): FREET3 Anemia work up: No results for input(s): VITAMINB12, FOLATE, FERRITIN, TIBC, IRON, RETICCTPCT in the last 72 hours. Sepsis Labs: Recent Labs  Lab 05/06/19 1211 05/06/19 1427 05/06/19 1654 05/08/19 0513  PROCALCITON  --   --   --  <0.10  WBC 6.6  --   --  2.9*  LATICACIDVEN  --  1.4 1.2  --     Microbiology Recent Results (from the past 240 hour(s))  Blood culture (routine x 2)     Status: None   Collection Time: 04/30/19  2:53 PM   Specimen: BLOOD  Result Value Ref Range Status   Specimen Description BLOOD SITE NOT SPECIFIED  Final   Special Requests   Final    BOTTLES DRAWN AEROBIC AND ANAEROBIC Blood Culture adequate volume   Culture NO GROWTH 5 DAYS  Final   Report Status 05/05/2019 FINAL  Final  Respiratory Panel by RT PCR (Flu A&B, Covid) - Urine, Clean Catch     Status: None   Collection Time: 04/30/19  3:07 PM   Specimen: Urine, Clean Catch  Result Value Ref Range Status   SARS Coronavirus 2 by RT PCR NEGATIVE NEGATIVE Final    Comment: (NOTE) SARS-CoV-2 target nucleic acids are NOT DETECTED. The SARS-CoV-2 RNA is generally detectable in upper respiratoy specimens during the acute phase of infection. The lowest concentration of SARS-CoV-2 viral copies this assay can detect is 131 copies/mL. A negative result does not preclude SARS-Cov-2 infection and should not be used as the sole basis for treatment or other patient management decisions. A negative result may occur with  improper specimen collection/handling, submission of specimen other than nasopharyngeal swab, presence of viral mutation(s) within the areas targeted by this assay, and inadequate number of viral copies (<131 copies/mL). A negative result must be combined with clinical observations, patient history, and epidemiological information. The expected  result is Negative. Fact Sheet for Patients:  PinkCheek.be Fact Sheet for Healthcare Providers:  GravelBags.it This test is not yet ap proved or cleared by the [place] FDA and  has been authorized for detection and/or diagnosis of SARS-CoV-2 by FDA under an Emergency Use Authorization (EUA). This EUA will remain  in effect (meaning this test can be used) for the duration of the COVID-19 declaration under Section 564(b)(1)  of the Act, 21 U.S.C. section 360bbb-3(b)(1), unless the authorization is terminated or revoked sooner.    Influenza A by PCR NEGATIVE NEGATIVE Final   Influenza B by PCR NEGATIVE NEGATIVE Final    Comment: (NOTE) The Xpert Xpress SARS-CoV-2/FLU/RSV assay is intended as an aid in  the diagnosis of influenza from Nasopharyngeal swab specimens and  should not be used as a sole basis for treatment. Nasal washings and  aspirates are unacceptable for Xpert Xpress SARS-CoV-2/FLU/RSV  testing. Fact Sheet for Patients: PinkCheek.be Fact Sheet for Healthcare Providers: GravelBags.it This test is not yet approved or cleared by the [place] FDA and  has been authorized for detection and/or diagnosis of SARS-CoV-2 by  FDA under an Emergency Use Authorization (EUA). This EUA will remain  in effect (meaning this test can be used) for the duration of the  Covid-19 declaration under Section 564(b)(1) of the Act, 21  U.S.C. section 360bbb-3(b)(1), unless the authorization is  terminated or revoked. Performed at [facility], [place] [address], [place], [place] [address]   Blood culture (routine x 2)     Status: None   Collection Time: 04/30/19  4:16 PM   Specimen: BLOOD  Result Value Ref Range Status   Specimen Description BLOOD LEFT ANTECUBITAL  Final   Special Requests   Final    BOTTLES DRAWN AEROBIC AND ANAEROBIC Blood Culture results may not be  optimal due to an inadequate volume of blood received in culture bottles   Culture NO GROWTH 5 DAYS  Final   Report Status 05/05/2019 FINAL  Final  SARS CORONAVIRUS 2 (TAT 6-24 HRS)     Status: None   Collection Time: 05/06/19  1:39 PM  Result Value Ref Range Status   SARS Coronavirus 2 NEGATIVE NEGATIVE Final    Comment: (NOTE) SARS-CoV-2 target nucleic acids are NOT DETECTED. The SARS-CoV-2 RNA is generally detectable in upper and lower respiratory specimens during the acute phase of infection. Negative results do not preclude SARS-CoV-2 infection, do not rule out co-infections with other pathogens, and should not be used as the sole basis for treatment or other patient management decisions. Negative results must be combined with clinical observations, patient history, and epidemiological information. The expected result is Negative. Fact Sheet for Patients: SugarRoll.be Fact Sheet for Healthcare Providers: https://www.woods-mathews.com/ This test is not yet approved or cleared by the [place] FDA and  has been authorized for detection and/or diagnosis of SARS-CoV-2 by FDA under an Emergency Use Authorization (EUA). This EUA will remain  in effect (meaning this test can be used) for the duration of the COVID-19 declaration under Section 56 4(b)(1) of the Act, 21 U.S.C. section 360bbb-3(b)(1), unless the authorization is terminated or revoked sooner. Performed at [facility], [place] [address], [place], [place] [address]   Blood culture (routine x 2)     Status: None (Preliminary result)   Collection Time: 05/06/19  2:14 PM   Specimen: BLOOD  Result Value Ref Range Status   Specimen Description BLOOD RIGHT ANTECUBITAL  Final   Special Requests   Final    BOTTLES DRAWN AEROBIC ONLY Blood Culture results may not be optimal due to an inadequate volume of blood received in culture bottles   Culture   Final    NO GROWTH 2  DAYS Performed at [facility], [place] [address], [place], [place] [address]    Report Status PENDING  Incomplete  Blood culture (routine x 2)  Status: None (Preliminary result)   Collection Time: 05/06/19  2:17 PM   Specimen: BLOOD  Result Value Ref Range Status   Specimen Description BLOOD LEFT ANTECUBITAL  Final   Special Requests   Final    BOTTLES DRAWN AEROBIC AND ANAEROBIC Blood Culture results may not be optimal due to an inadequate volume of blood received in culture bottles   Culture   Final    NO GROWTH 2 DAYS Performed at [place] [address] [place], [place] [address]    Report Status PENDING  Incomplete    Procedures and diagnostic studies:  ECHOCARDIOGRAM COMPLETE  Result Date: 05/06/2019    ECHOCARDIOGRAM REPORT   Patient Name:   Victoria Stokes Date of Exam: 05/06/2019 Medical Rec #:  [id number]   Height:       67.0 in Accession #:    [id number]  Weight:       138.9 lb Date of Birth:  1955/09/01    BSA:          1.732 m Patient Age:    8 years    BP:           114/76 mmHg Patient Gender: F           HR:           91 bpm. Exam Location:  Inpatient Procedure: 2D Echo, Cardiac Doppler and Color Doppler Indications:    R07.9* Chest pain, unspecified; I31.3 Pericardial effusion  History:        Patient has no prior history of Echocardiogram examinations.                 Seizures.  Sonographer:    Jonelle Sidle Dance Referring Phys: [id number] Hector  1. Left ventricular ejection fraction, by estimation, is 60 to 65%. The left ventricle has normal function. The left ventricle has no regional wall motion abnormalities. Left ventricular diastolic parameters were normal.  2. Right ventricular systolic function is normal. The right ventricular size is normal. There is normal pulmonary artery systolic pressure.  3. No evidence of tamponade . small to moderate. The pericardial effusion is posterior and lateral to the left ventricle and anterior to the right  ventricle.  4. The mitral valve is normal in structure. Trivial mitral valve regurgitation. No evidence of mitral stenosis.  5. The aortic valve is tricuspid. Aortic valve regurgitation is trivial. Mild to moderate aortic valve sclerosis/calcification is present, without any evidence of aortic stenosis.  6. The inferior vena cava is dilated in size with >50% respiratory variability, suggesting right atrial pressure of 8 mmHg. FINDINGS  Left Ventricle: Left ventricular ejection fraction, by estimation, is 60 to 65%. The left ventricle has normal function. The left ventricle has no regional wall motion abnormalities. The left ventricular internal cavity size was normal in size. There is  no left ventricular hypertrophy. Left ventricular diastolic parameters were normal. Right Ventricle: The right ventricular size is normal. No increase in right ventricular wall thickness. Right ventricular systolic function is normal. There is normal pulmonary artery systolic pressure. The tricuspid regurgitant velocity is 1.77 m/s, and  with an assumed right atrial pressure of 8 mmHg, the estimated right ventricular systolic pressure is 123XX123 mmHg. Left Atrium: Left atrial size was normal in size. Right Atrium: Right atrial size was normal in size. Pericardium: No evidence of tamponade. Small to moderate. The pericardial effusion is posterior and lateral to the left ventricle and anterior to the right ventricle. Mitral  Valve: The mitral valve is normal in structure. There is mild thickening of the mitral valve leaflet(s). Normal mobility of the mitral valve leaflets. Trivial mitral valve regurgitation. No evidence of mitral valve stenosis. Tricuspid Valve: The tricuspid valve is normal in structure. Tricuspid valve regurgitation is mild . No evidence of tricuspid stenosis. Aortic Valve: The aortic valve is tricuspid. Aortic valve regurgitation is trivial. Aortic regurgitation PHT measures 486 msec. Mild to moderate aortic valve  sclerosis/calcification is present, without any evidence of aortic stenosis. Pulmonic Valve: The pulmonic valve was normal in structure. Pulmonic valve regurgitation is not visualized. No evidence of pulmonic stenosis. Aorta: The aortic root is normal in size and structure. Venous: The inferior vena cava is dilated in size with greater than 50% respiratory variability, suggesting right atrial pressure of 8 mmHg. IAS/Shunts: No atrial level shunt detected by color flow Doppler.  LEFT VENTRICLE PLAX 2D LVIDd:         4.53 cm  Diastology LVIDs:         2.76 cm  LV e' lateral:   8.49 cm/s LV PW:         1.09 cm  LV E/e' lateral: 8.9 LV IVS:        0.83 cm  LV e' medial:    8.59 cm/s LVOT diam:     2.00 cm  LV E/e' medial:  8.8 LV SV:         67 LV SV Index:   39 LVOT Area:     3.14 cm  RIGHT VENTRICLE             IVC RV Basal diam:  2.56 cm     IVC diam: 2.55 cm RV S prime:     10.40 cm/s TAPSE (M-mode): 1.8 cm LEFT ATRIUM             Index       RIGHT ATRIUM           Index LA diam:        3.40 cm 1.96 cm/m  RA Area:     14.20 cm LA Vol (A2C):   44.6 ml 25.75 ml/m RA Volume:   34.40 ml  19.86 ml/m LA Vol (A4C):   30.8 ml 17.78 ml/m LA Biplane Vol: 38.2 ml 22.06 ml/m  AORTIC VALVE LVOT Vmax:   111.00 cm/s LVOT Vmean:  74.200 cm/s LVOT VTI:    0.213 m AI PHT:      486 msec  AORTA Ao Root diam: 3.40 cm Ao Asc diam:  3.40 cm MITRAL VALVE               TRICUSPID VALVE MV Area (PHT): 3.08 cm    TR Peak grad:   12.5 mmHg MV Decel Time: 246 msec    TR Vmax:        177.00 cm/s MV E velocity: 75.70 cm/s MV A velocity: 65.00 cm/s  SHUNTS MV E/A ratio:  1.16        Systemic VTI:  0.21 m                            Systemic Diam: 2.00 cm Jenkins Rouge MD Electronically signed by Jenkins Rouge MD Signature Date/Time: 05/06/2019/4:31:31 PM    Final     Medications:   . cholecalciferol  5,000 Units Oral Daily  . colchicine  0.6 mg Oral BID  . enoxaparin (LOVENOX) injection  40 mg Subcutaneous Q24H  .  melatonin  9 mg Oral  QHS  . pantoprazole  20 mg Oral Daily  . vitamin A  10,000 Units Oral Daily  . vitamin B-12  100 mcg Oral Daily   Continuous Infusions:    LOS: 0 days   Geradine Girt  [facility]   How to contact the [facility] Attending or Consulting provider [place] or covering provider during after hours [place], for this patient?  1. Check the care team in [facility] and look for a) attending/consulting [facility] provider listed and b) the [facility] team listed 2. Log into [email or web address] and use [facility]'s universal password to access. If you do not have the password, please contact the hospital operator. 3. Locate the [facility] provider you are looking for under [facility] and page to a number that you can be directly reached. 4. If you still have difficulty reaching the provider, please page the [facility] (Director on Call) for the Hospitalists listed on amion for assistance.  05/08/2019, 1:47 PM

## 2019-05-08 NOTE — Progress Notes (Addendum)
Progress Note  Patient Name: Victoria Stokes Date of Encounter: 05/08/2019  Primary Cardiologist: Elouise Munroe, MD (New Patient)  Subjective   Patient reports chest pain is improving. She ranks as a 4/10 on the pain scale which is improved from 8-10/10 in presentation. Pleuritic in nature. Non-exertional. Breathing improving. Using insensitive spirometry. She did develop some diarrhea which may be from Colchicine. Noticed some red-tinged in her stools so primary team has held Ibuprofen and is checking a hemoccult.   Inpatient Medications    Scheduled Meds: . cholecalciferol  5,000 Units Oral Daily  . colchicine  0.6 mg Oral BID  . enoxaparin (LOVENOX) injection  40 mg Subcutaneous Q24H  . melatonin  9 mg Oral QHS  . pantoprazole  20 mg Oral Daily  . vitamin A  10,000 Units Oral Daily  . vitamin B-12  100 mcg Oral Daily   Continuous Infusions:  PRN Meds: acetaminophen **OR** acetaminophen, HYDROcodone-acetaminophen, zolpidem   Vital Signs    Vitals:   05/08/19 0007 05/08/19 0457 05/08/19 0515 05/08/19 1149  BP: 120/78  132/81 120/86  Pulse: 89 76 83 80  Resp: 18  19 14   Temp: 98.2 F (36.8 C) 98.1 F (36.7 C) 98.2 F (36.8 C) 98.8 F (37.1 C)  TempSrc: Oral Oral Oral Oral  SpO2: 92% 93% 96% 96%  Weight:      Height:       No intake or output data in the 24 hours ending 05/08/19 1545 Last 3 Weights 05/06/2019 04/30/2019 01/10/2017  Weight (lbs) 138 lb 14.2 oz 140 lb 145 lb 1.6 oz  Weight (kg) 63 kg 63.504 kg 65.817 kg      Telemetry    Sinus rhythm with rates in the 70's to 90's with PVCs. - Personally Reviewed  ECG    No new ECG tracing today. - Personally Reviewed  Physical Exam   GEN: No acute distress.   Neck: Supple. No JVD. Cardiac: RRR. No murmurs, rubs, or gallops. Radial pulses 2+ and equal bilaterally.  Respiratory: No increased work of breathing. Mild crackles in bases (left > right). GI: Soft, non-distended, and non-tender. Bowel sounds  present. MS: No lower extremity edema. No deformity. Skin: Warm and dry. Neuro:  No focal deficits. Psych: Normal affect. Responds appropriately.   Labs    High Sensitivity Troponin:   Recent Labs  Lab 04/30/19 1231 04/30/19 1429 05/06/19 1211 05/06/19 1417  TROPONINIHS 8 9 24* 20*      Chemistry Recent Labs  Lab 05/06/19 1211 05/07/19 0548 05/08/19 0513  NA 125* 128* 131*  K 4.4 3.9 4.1  CL 89* 94* 97*  CO2 23 25 25   GLUCOSE 109* 92 90  BUN 7* 7* 8  CREATININE 0.69 0.67 0.59  CALCIUM 8.4* 8.2* 8.2*  GFRNONAA >60 >60 >60  GFRAA >60 >60 >60  ANIONGAP 13 9 9      Hematology Recent Labs  Lab 05/06/19 1211 05/08/19 0513  WBC 6.6 2.9*  RBC 3.50* 3.12*  HGB 10.3* 9.2*  HCT 31.0* 27.8*  MCV 88.6 89.1  MCH 29.4 29.5  MCHC 33.2 33.1  RDW 12.9 13.1  PLT 387 354    BNPNo results for input(s): BNP, PROBNP in the last 168 hours.   DDimer No results for input(s): DDIMER in the last 168 hours.   Radiology    DG Chest 2 View  Result Date: 05/08/2019 CLINICAL DATA:  Abdominal distension EXAM: CHEST - 2 VIEW COMPARISON:  05/06/2019 FINDINGS: Cardiac shadow is enlarged.  Bilateral pleural effusions are noted left greater than right. Left basilar infiltrate is noted in the retrocardiac region. No new focal abnormality is seen. IMPRESSION: Stable appearance of the chest when compared with the previous exam. Electronically Signed   By: Inez Catalina M.D.   On: 05/08/2019 14:32   DG Abd 1 View  Result Date: 05/08/2019 CLINICAL DATA:  Abdominal distension EXAM: ABDOMEN - 1 VIEW COMPARISON:  None. FINDINGS: Scattered large and small bowel gas is noted. Fecal material is noted throughout the colon consistent with a mild degree of constipation. No obstructive changes are seen. Degenerative changes of lumbar spine and scoliosis concave to the left are seen. No mass or abnormal calcifications are seen. IMPRESSION: Mild constipation. Electronically Signed   By: Inez Catalina M.D.   On:  05/08/2019 14:33   ECHOCARDIOGRAM COMPLETE  Result Date: 05/06/2019    ECHOCARDIOGRAM REPORT   Patient Name:   BRYAN MOLDENHAUER Date of Exam: 05/06/2019 Medical Rec #:  [id number]   Height:       67.0 in Accession #:    [id number]  Weight:       138.9 lb Date of Birth:  04/15/1955    BSA:          1.732 m Patient Age:    64 years    BP:           114/76 mmHg Patient Gender: F           HR:           91 bpm. Exam Location:  Inpatient Procedure: 2D Echo, Cardiac Doppler and Color Doppler Indications:    R07.9* Chest pain, unspecified; I31.3 Pericardial effusion  History:        Patient has no prior history of Echocardiogram examinations.                 Seizures.  Sonographer:    Jonelle Sidle Dance Referring Phys: [id number] [place]  1. Left ventricular ejection fraction, by estimation, is 60 to 65%. The left ventricle has normal function. The left ventricle has no regional wall motion abnormalities. Left ventricular diastolic parameters were normal.  2. Right ventricular systolic function is normal. The right ventricular size is normal. There is normal pulmonary artery systolic pressure.  3. No evidence of tamponade . small to moderate. The pericardial effusion is posterior and lateral to the left ventricle and anterior to the right ventricle.  4. The mitral valve is normal in structure. Trivial mitral valve regurgitation. No evidence of mitral stenosis.  5. The aortic valve is tricuspid. Aortic valve regurgitation is trivial. Mild to moderate aortic valve sclerosis/calcification is present, without any evidence of aortic stenosis.  6. The inferior vena cava is dilated in size with >50% respiratory variability, suggesting right atrial pressure of 8 mmHg. FINDINGS  Left Ventricle: Left ventricular ejection fraction, by estimation, is 60 to 65%. The left ventricle has normal function. The left ventricle has no regional wall motion abnormalities. The left ventricular internal cavity size was normal in size. There  is  no left ventricular hypertrophy. Left ventricular diastolic parameters were normal. Right Ventricle: The right ventricular size is normal. No increase in right ventricular wall thickness. Right ventricular systolic function is normal. There is normal pulmonary artery systolic pressure. The tricuspid regurgitant velocity is 1.77 m/s, and  with an assumed right atrial pressure of 8 mmHg, the estimated right ventricular systolic pressure is 123XX123 mmHg. Left Atrium: Left atrial size was normal in size.  Right Atrium: Right atrial size was normal in size. Pericardium: No evidence of tamponade. Small to moderate. The pericardial effusion is posterior and lateral to the left ventricle and anterior to the right ventricle. Mitral Valve: The mitral valve is normal in structure. There is mild thickening of the mitral valve leaflet(s). Normal mobility of the mitral valve leaflets. Trivial mitral valve regurgitation. No evidence of mitral valve stenosis. Tricuspid Valve: The tricuspid valve is normal in structure. Tricuspid valve regurgitation is mild . No evidence of tricuspid stenosis. Aortic Valve: The aortic valve is tricuspid. Aortic valve regurgitation is trivial. Aortic regurgitation PHT measures 486 msec. Mild to moderate aortic valve sclerosis/calcification is present, without any evidence of aortic stenosis. Pulmonic Valve: The pulmonic valve was normal in structure. Pulmonic valve regurgitation is not visualized. No evidence of pulmonic stenosis. Aorta: The aortic root is normal in size and structure. Venous: The inferior vena cava is dilated in size with greater than 50% respiratory variability, suggesting right atrial pressure of 8 mmHg. IAS/Shunts: No atrial level shunt detected by color flow Doppler.  LEFT VENTRICLE PLAX 2D LVIDd:         4.53 cm  Diastology LVIDs:         2.76 cm  LV e' lateral:   8.49 cm/s LV PW:         1.09 cm  LV E/e' lateral: 8.9 LV IVS:        0.83 cm  LV e' medial:    8.59 cm/s LVOT  diam:     2.00 cm  LV E/e' medial:  8.8 LV SV:         67 LV SV Index:   39 LVOT Area:     3.14 cm  RIGHT VENTRICLE             IVC RV Basal diam:  2.56 cm     IVC diam: 2.55 cm RV S prime:     10.40 cm/s TAPSE (M-mode): 1.8 cm LEFT ATRIUM             Index       RIGHT ATRIUM           Index LA diam:        3.40 cm 1.96 cm/m  RA Area:     14.20 cm LA Vol (A2C):   44.6 ml 25.75 ml/m RA Volume:   34.40 ml  19.86 ml/m LA Vol (A4C):   30.8 ml 17.78 ml/m LA Biplane Vol: 38.2 ml 22.06 ml/m  AORTIC VALVE LVOT Vmax:   111.00 cm/s LVOT Vmean:  74.200 cm/s LVOT VTI:    0.213 m AI PHT:      486 msec  AORTA Ao Root diam: 3.40 cm Ao Asc diam:  3.40 cm MITRAL VALVE               TRICUSPID VALVE MV Area (PHT): 3.08 cm    TR Peak grad:   12.5 mmHg MV Decel Time: 246 msec    TR Vmax:        177.00 cm/s MV E velocity: 75.70 cm/s MV A velocity: 65.00 cm/s  SHUNTS MV E/A ratio:  1.16        Systemic VTI:  0.21 m                            Systemic Diam: 2.00 cm Jenkins Rouge MD Electronically signed by Jenkins Rouge MD Signature Date/Time: 05/06/2019/4:31:31 PM  Final     Cardiac Studies   Echocardiogram 05/06/2019: Impressions: 1. Left ventricular ejection fraction, by estimation, is 60 to 65%. The  left ventricle has normal function. The left ventricle has no regional  wall motion abnormalities. Left ventricular diastolic parameters were  normal.  2. Right ventricular systolic function is normal. The right ventricular  size is normal. There is normal pulmonary artery systolic pressure.  3. No evidence of tamponade . small to moderate. The pericardial effusion  is posterior and lateral to the left ventricle and anterior to the right  ventricle.  4. The mitral valve is normal in structure. Trivial mitral valve  regurgitation. No evidence of mitral stenosis.  5. The aortic valve is tricuspid. Aortic valve regurgitation is trivial.  Mild to moderate aortic valve sclerosis/calcification is present, without    any evidence of aortic stenosis.  6. The inferior vena cava is dilated in size with >50% respiratory  variability, suggesting right atrial pressure of 8 mmHg.   Patient Profile   Ms. Victoria Stokes is a 64 y.o. female with a history of chronic hyponatremia due to polydipsia/water potomania, MGUS followed at [facility], and remote history of seizures in setting of dehydration/hyponatremia who is being seen for the evaluation of pericarditis at the request of Dr. Eliseo Squires.   Assessment & Plan    Pericardial Effusion/Pericarditis Patient presented with chest pain, fever, shortness of breath, and fatigue over the last several weeks. High-sensitivity troponin minimally elevated and flat at 24 >> 20. EKG showed non-specific ST/T changes. Chest x-ray showed more prominent cardiac silhouette. - Echo showed LVEF of 60-65% with small to moderate pericardial effusion with no evidence of tamponade. Of note, small effusion was noted on CTA back in 04/2016.  - CRP elevated at 16.2 and Sed Rate normal.  - Chest pain improving.  - Continue Colchicine. She has had some diarrhea so will continue to monitor. - Ibuprofen discontinued today due to red-tinged stools and dropping hemoglobin. Hemoccult pending. - Also started on Protonix.  - Possible autoimmune etiology given arthritic/joint pain and intermittent fevers. Consider rheumatologic work-up.  - Will plan for repeat Echo in 2 weeks and then follow-up in office with Dr. Margaretann Loveless.   Community Acquired Pneumonia - Chest x-ray with possible pneumonia and bilateral pleural effusion. - Lactic acid normal. WBC 2.9 today.  - Antibiotics per primary team.   Acute on Chronic Hyponatremia - Slowly improving. 131 today.  - Possible due to Potomania.  - Received sodium tablets yesterday.  - Continue fluid restriction. - Management per primary team.   Otherwise, per primary team.    For questions or updates, please contact [place] Please consult [email or web address] for  contact info under        Signed, Darreld Mclean, PA-C  05/08/2019, 3:45 PM    Patient seen and examined with Virgie Dad PAC.  Agree as above, with the following exceptions and changes as noted below. Feeling slightly better, however now with blood tinged stools. Gen: NAD, CV: RRR, no murmurs, Lungs: clear, Abd: soft, Extrem: Warm, well perfused, no edema, Neuro/Psych: alert and oriented x 3, normal mood and affect. All available labs, radiology testing, previous records reviewed.  - continue colchicine but will reduce dose due to diarrhea, 0.3 mg Bid - ibuprofen on hold, was taking 4 tabs most days of the week at home prior to presentation.  - with fevers cannot exclude viral pericarditis, however with diffuse symptoms more concerned for autoimmune etiology. Will likely need prednisone, rheum eval pending  outpatient.  - no clinical features of tamponade. Reassess with limited echo in 2 weeks and follow up with me.  Elouise Munroe 05/08/19 11:14 PM

## 2019-05-09 DIAGNOSIS — I318 Other specified diseases of pericardium: Secondary | ICD-10-CM | POA: Diagnosis not present

## 2019-05-09 LAB — BASIC METABOLIC PANEL
Anion gap: 11 (ref 5–15)
BUN: 5 mg/dL — ABNORMAL LOW (ref 8–23)
CO2: 23 mmol/L (ref 22–32)
Calcium: 8.4 mg/dL — ABNORMAL LOW (ref 8.9–10.3)
Chloride: 96 mmol/L — ABNORMAL LOW (ref 98–111)
Creatinine, Ser: 0.6 mg/dL (ref 0.44–1.00)
GFR calc Af Amer: >60 mL/min (ref >60)
GFR calc non Af Amer: >60 mL/min (ref >60)
Glucose, Bld: 91 mg/dL (ref 70–99)
Potassium: 3.9 mmol/L (ref 3.5–5.1)
Sodium: 130 mmol/L — ABNORMAL LOW (ref 135–145)

## 2019-05-09 LAB — CBC
HCT: 32 % — ABNORMAL LOW (ref 36.0–46.0)
Hemoglobin: 10.7 g/dL — ABNORMAL LOW (ref 12.0–15.0)
MCH: 29.1 pg (ref 26.0–34.0)
MCHC: 33.4 g/dL (ref 30.0–36.0)
MCV: 87 fL (ref 80.0–100.0)
Platelets: 459 10*3/uL — ABNORMAL HIGH (ref 150–400)
RBC: 3.68 MIL/uL — ABNORMAL LOW (ref 3.87–5.11)
RDW: 12.9 % (ref 11.5–15.5)
WBC: 4.1 10*3/uL (ref 4.0–10.5)
nRBC: 0 % (ref 0.0–0.2)

## 2019-05-09 LAB — IRON AND TIBC
Iron: 22 ug/dL — ABNORMAL LOW (ref 28–170)
Saturation Ratios: 11 % (ref 10.4–31.8)
TIBC: 195 ug/dL — ABNORMAL LOW (ref 250–450)
UIBC: 173 ug/dL

## 2019-05-09 LAB — OCCULT BLOOD X 1 CARD TO LAB, STOOL
Fecal Occult Bld: POSITIVE — AB
Fecal Occult Bld: POSITIVE — AB

## 2019-05-09 LAB — C-REACTIVE PROTEIN: CRP: 9.5 mg/dL — ABNORMAL HIGH (ref <1.0)

## 2019-05-09 LAB — SEDIMENTATION RATE: Sed Rate: 4 mm/hr (ref 0–22)

## 2019-05-09 LAB — FERRITIN: Ferritin: 287 ng/mL (ref 11–307)

## 2019-05-09 MED ORDER — IBUPROFEN 400 MG PO TABS: 600.0000 mg | ORAL_TABLET | Freq: Two times a day (BID) | ORAL | Status: DC

## 2019-05-09 NOTE — Progress Notes (Signed)
Progress Note    [facility]  [id number] DOB: 15-Jan-1956  DOA: 05/06/2019 PCP: Victoria Lass, MD    Brief Narrative:     Medical records reviewed and are as summarized below:  [facility] is an 64 y.o. 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  Chest pain has been sharp-like, 5-6 over 10, worsening with lying down and deep breath, and somewhat relieved with sitting up, and she denied any cough.  Told her chart her temperature was 100.5 last week, she has been taking Advil every day, so daughter concerned about her actual fever probably was higher.   She came to ED last week, CT angiogram was negative for PE but mild bilateral pleural effusion.  She went to her PCP this week, was prescribed with amoxicillin, for possible pneumonia, and her PCP also suspected that patient may have " pericarditis".  Meanwhile, during the last few weeks, patient started to feel short of breath associated with exertion, no orthopnea, denies any swelling.  And admitted that she few thirsty all the time and drinks about 1 to 1.5 gallons of fluid every day.   Assessment/Plan:   Active Problems:   Hyponatremia   Community acquired pneumonia   Pericardial effusion   Pericarditis     Chest pain/exertional dyspnea with pericardial effusion: pericarditis -troponin  flat -Echo: EF of 60 to 65% with no regional wall motion abnormalities.  No evidence of tamponade but there is a small to moderate pericardial effusion in the posterior and lateral to the left ventricle and anterior to the right ventricle -cardiology consult appreciated -Patient does have some some small pleural effusions left greater than right: Still does not appear to be enough to get a thoracentesis and laboratory data from -Patient's lung sound better after incentive spirometry -Plan to  continue colchicine for 3 months -Patient will also need echo in 2 weeks to ensure stabilization  Anemia -Appears to be of chronic inflammation -Suspect laboratory data yesterday was an abnormality as today's is more consistent  Possible autoimmune disorder -Patient reported last summer and autumn she had to prolonged episodes of migrating joint swelling and pain in her fingers wrist, elbow and knees and low grade fever but no rash.   -She went to Dr. Addison [facility] who did some work-up -Her PCP recommended her to go see a rheumatologist, which she has not able to set up until April: I have called Dr. [facility] office to see if patient can be seen sooner as we suspect her pericardial effusion is related to inflammatory/autoimmune issues -I have reviewed her labs from outside facility including: Labs from 8/20 and 11/20 with a negative rheumatoid factor.  Patient had 3 ANAs drawn all 3 were positive with the patterns of 1: 160--> 1: 1280--> 1: 640 -will order further work-up including the following labs: Antiextractable nuclear antigens: Including Smith, RNP, SSA/SSB Will reach out to [facility] to see if patient can get click or follow-up with them - will also discuss with patient a trial of steroids at 20 mg daily for a few days to then drop to 15 mg and taper from there  Acute on Chronic hyponatremia, -Serum osmolality low, urine osmolality normal, sodium urine: < 10 -Patient does states she drinks a lot of water so could be from water Poto mania -Continue to monitor, liberalize patient salt intake.  MGUS -  Patient F/U outpatient with oncologist at [facility].   -Last visit was in October 2020: Victoria [facility] is a 64 y.o. female with IgG kappa plasma cell dyscrasia and xrays suspicious for lytic lesions but a PET/CT without any evidence of FDG-avid osseous disease, who presents in follow up. Disease is most consistent with MGUS/early smoldering MM.   When she presented to our clinic, her  M-spike had improved, she had a low burden of disease in her marrow, no dysregulation of her free light chains, and minimal proteinuria. She also has no anemia, hypercalcemia, or renal dysfunction. The results of the PET/CT showed there was no evidence of FDG-avid osseous disease, surprisingly, there isn't even uptake around the T4 fracture, making me question the chronicity of this lesion. Her case was discussed with Drs. [facility] and [facility] who presented her case at the [facility] Metast[facility] Board. Following this, their recommendation was prospective monitoring. 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  In February, she had a small progression in M-spike and new dysregulation of light chains which is consistent with MGUS disease progression. This may have been as a result of a change in her lifestyle. Following resuming a strict keto diet, her labs improved. This past Summer she suffered some type of viral insult creating significant inflammatory stressors in her body, her platelet elevation is likely still a result of reactivity. I am pleased to see her Mspike is improved but FLC slightly disregulated, possibly as a result. Will CTM and encourage healthy lifestyle.   CAP -Was started on amoxicillin as an outpatient -Changed over to ceftriaxone/azithromycin -Procalcitonin less than 0.1, doubt infectious process so will DC antibiotics  Abdominal distention -Bowel regimen -Suspect due to constipation   Family Communication/Anticipated D/C date and plan/Code Status    Code Status: Full Code.  Family Communication: Daughter at bedside Disposition Plan: Pending improvement of symptoms and ability to get patient a sooner appointment with rheumatology I have reached out to Dr. Melissa [facility] office to see if patient can be worked in sooner.  Await callback  Medical Consultants:    Cardiology     Subjective:   Chest pain off the ibuprofen appears to be worse,  requiring doses of [facility]  Objective:    Vitals:   05/08/19 2314 05/09/19 0525 05/09/19 0756 05/09/19 1154  BP: 127/77 139/89 (!) 144/95 117/73  Pulse: 87 87 96 82  Resp: 17 16  18   Temp: 99.7 F (37.6 C) 98.1 F (36.7 C)  98.7 F (37.1 C)  TempSrc: Oral Oral  Oral  SpO2: 94% 95%  97%  Weight:      Height:        Intake/Output Summary (Last 24 hours) at 05/09/2019 1629 Last data filed at 05/09/2019 1307 Gross per 24 hour  Intake 480 ml  Output --  Net 480 ml   Filed Weights   05/06/19 1403  Weight: 63 kg    Exam: In bed, appears comfortable Lungs clear in the upper lobes but diminished slightly in the left lower lobe Regular rate and rhythm No lower extremity edema Positive bowel sounds, soft nontender   Data Reviewed:   I have personally reviewed following labs and imaging studies:  Labs: Labs show the following:   Basic Metabolic Panel: Recent Labs  Lab 05/06/19 1211 05/06/19 1211 05/07/19 0548 05/07/19 0548 05/08/19 0513 05/09/19 0745  NA 125*  --  128*  --  131* 130*  K 4.4   < > 3.9   < >  4.1 3.9  CL 89*  --  94*  --  97* 96*  CO2 23  --  25  --  25 23  GLUCOSE 109*  --  92  --  90 91  BUN 7*  --  7*  --  8 5*  CREATININE 0.69  --  0.67  --  0.59 0.60  CALCIUM 8.4*  --  8.2*  --  8.2* 8.4*   < > = values in this interval not displayed.   GFR Estimated Creatinine Clearance: 70.9 mL/min (by C-G formula based on SCr of 0.6 mg/dL). Liver Function Tests: No results for input(s): AST, ALT, ALKPHOS, BILITOT, PROT, ALBUMIN in the last 168 hours. No results for input(s): LIPASE, AMYLASE in the last 168 hours. No results for input(s): AMMONIA in the last 168 hours. Coagulation profile No results for input(s): INR, PROTIME in the last 168 hours.  CBC: Recent Labs  Lab 05/06/19 1211 05/08/19 0513 05/09/19 0745  WBC 6.6 2.9* 4.1  NEUTROABS  --  2.3  --   HGB 10.3* 9.2* 10.7*  HCT 31.0* 27.8* 32.0*  MCV 88.6 89.1 87.0  PLT 387 354 459*    Cardiac Enzymes: No results for input(s): CKTOTAL, CKMB, CKMBINDEX, TROPONINI in the last 168 hours. BNP (last 3 results) No results for input(s): PROBNP in the last 8760 hours. CBG: No results for input(s): GLUCAP in the last 168 hours. D-Dimer: No results for input(s): DDIMER in the last 72 hours. Hgb A1c: No results for input(s): HGBA1C in the last 72 hours. Lipid Profile: No results for input(s): CHOL, HDL, LDLCALC, TRIG, CHOLHDL, LDLDIRECT in the last 72 hours. Thyroid function studies: No results for input(s): TSH, T4TOTAL, T3FREE, THYROIDAB in the last 72 hours.  Invalid input(s): FREET3 Anemia work up: Recent Labs    05/09/19 0745  FERRITIN 287  TIBC 195*  IRON 22*   Sepsis Labs: Recent Labs  Lab 05/06/19 1211 05/06/19 1427 05/06/19 1654 05/08/19 0513 05/09/19 0745  PROCALCITON  --   --   --  <0.10  --   WBC 6.6  --   --  2.9* 4.1  LATICACIDVEN  --  1.4 1.2  --   --     Microbiology Recent Results (from the past 240 hour(s))  Blood culture (routine x 2)     Status: None   Collection Time: 04/30/19  2:53 PM   Specimen: BLOOD  Result Value Ref Range Status   Specimen Description BLOOD SITE NOT SPECIFIED  Final   Special Requests   Final    BOTTLES DRAWN AEROBIC AND ANAEROBIC Blood Culture adequate volume   Culture NO GROWTH 5 DAYS  Final   Report Status 05/05/2019 FINAL  Final  Respiratory Panel by RT PCR (Flu A&B, Covid) - Urine, Clean Catch     Status: None   Collection Time: 04/30/19  3:07 PM   Specimen: Urine, Clean Catch  Result Value Ref Range Status   SARS Coronavirus 2 by RT PCR NEGATIVE NEGATIVE Final    Comment: (NOTE) SARS-CoV-2 target nucleic acids are NOT DETECTED. The SARS-CoV-2 RNA is generally detectable in upper respiratoy specimens during the acute phase of infection. The lowest concentration of SARS-CoV-2 viral copies this assay can detect is 131 copies/mL. A negative result does not preclude SARS-Cov-2 infection and should not  be used as the sole basis for treatment or other patient management decisions. A negative result may occur with  improper specimen collection/handling, submission of specimen other than nasopharyngeal swab,  presence of viral mutation(s) within the areas targeted by this assay, and inadequate number of viral copies (<131 copies/mL). A negative result must be combined with clinical observations, patient history, and epidemiological information. The expected result is Negative. Fact Sheet for Patients:  PinkCheek.be Fact Sheet for Healthcare Providers:  GravelBags.it This test is not yet ap proved or cleared by the [place] FDA and  has been authorized for detection and/or diagnosis of SARS-CoV-2 by FDA under an Emergency Use Authorization (EUA). This EUA will remain  in effect (meaning this test can be used) for the duration of the COVID-19 declaration under Section 564(b)(1) of the Act, 21 U.S.C. section 360bbb-3(b)(1), unless the authorization is terminated or revoked sooner.    Influenza A by PCR NEGATIVE NEGATIVE Final   Influenza B by PCR NEGATIVE NEGATIVE Final    Comment: (NOTE) The Xpert Xpress SARS-CoV-2/FLU/RSV assay is intended as an aid in  the diagnosis of influenza from Nasopharyngeal swab specimens and  should not be used as a sole basis for treatment. Nasal washings and  aspirates are unacceptable for Xpert Xpress SARS-CoV-2/FLU/RSV  testing. Fact Sheet for Patients: PinkCheek.be Fact Sheet for Healthcare Providers: GravelBags.it This test is not yet approved or cleared by the [place] FDA and  has been authorized for detection and/or diagnosis of SARS-CoV-2 by  FDA under an Emergency Use Authorization (EUA). This EUA will remain  in effect (meaning this test can be used) for the duration of the  Covid-19 declaration under Section 564(b)(1) of  the Act, 21  U.S.C. section 360bbb-3(b)(1), unless the authorization is  terminated or revoked. Performed at [facility], [place] [address], [place], [place] [address]   Blood culture (routine x 2)     Status: None   Collection Time: 04/30/19  4:16 PM   Specimen: BLOOD  Result Value Ref Range Status   Specimen Description BLOOD LEFT ANTECUBITAL  Final   Special Requests   Final    BOTTLES DRAWN AEROBIC AND ANAEROBIC Blood Culture results may not be optimal due to an inadequate volume of blood received in culture bottles   Culture NO GROWTH 5 DAYS  Final   Report Status 05/05/2019 FINAL  Final  SARS CORONAVIRUS 2 (TAT 6-24 HRS)     Status: None   Collection Time: 05/06/19  1:39 PM  Result Value Ref Range Status   SARS Coronavirus 2 NEGATIVE NEGATIVE Final    Comment: (NOTE) SARS-CoV-2 target nucleic acids are NOT DETECTED. The SARS-CoV-2 RNA is generally detectable in upper and lower respiratory specimens during the acute phase of infection. Negative results do not preclude SARS-CoV-2 infection, do not rule out co-infections with other pathogens, and should not be used as the sole basis for treatment or other patient management decisions. Negative results must be combined with clinical observations, patient history, and epidemiological information. The expected result is Negative. Fact Sheet for Patients: SugarRoll.be Fact Sheet for Healthcare Providers: https://www.woods-mathews.com/ This test is not yet approved or cleared by the [place] FDA and  has been authorized for detection and/or diagnosis of SARS-CoV-2 by FDA under an Emergency Use Authorization (EUA). This EUA will remain  in effect (meaning this test can be used) for the duration of the COVID-19 declaration under Section 56 4(b)(1) of the Act, 21 U.S.C. section 360bbb-3(b)(1), unless the authorization is terminated or revoked sooner. Performed at [place], [place] [address], [place], [place] [address]   Blood culture (routine x 2)  Status: None (Preliminary result)   Collection Time: 05/06/19  2:14 PM   Specimen: BLOOD  Result Value Ref Range Status   Specimen Description BLOOD RIGHT ANTECUBITAL  Final   Special Requests   Final    BOTTLES DRAWN AEROBIC ONLY Blood Culture results may not be optimal due to an inadequate volume of blood received in culture bottles   Culture   Final    NO GROWTH 3 DAYS Performed at [facility], [place] [address], [place], [place] [address]    Report Status PENDING  Incomplete  Blood culture (routine x 2)     Status: None (Preliminary result)   Collection Time: 05/06/19  2:17 PM   Specimen: BLOOD  Result Value Ref Range Status   Specimen Description BLOOD LEFT ANTECUBITAL  Final   Special Requests   Final    BOTTLES DRAWN AEROBIC AND ANAEROBIC Blood Culture results may not be optimal due to an inadequate volume of blood received in culture bottles   Culture   Final    NO GROWTH 3 DAYS Performed at [facility], [place] [address], [place], [place] [address]    Report Status PENDING  Incomplete    Procedures and diagnostic studies:  DG Chest 2 View  Result Date: 05/08/2019 CLINICAL DATA:  Abdominal distension EXAM: CHEST - 2 VIEW COMPARISON:  05/06/2019 FINDINGS: Cardiac shadow is enlarged. Bilateral pleural effusions are noted left greater than right. Left basilar infiltrate is noted in the retrocardiac region. No new focal abnormality is seen. IMPRESSION: Stable appearance of the chest when compared with the previous exam. Electronically Signed   By: Inez Catalina M.D.   On: 05/08/2019 14:32   DG Abd 1 View  Result Date: 05/08/2019 CLINICAL DATA:  Abdominal distension EXAM: ABDOMEN - 1 VIEW COMPARISON:  None. FINDINGS: Scattered large and small bowel gas is noted. Fecal material is noted throughout the colon consistent with a mild degree of constipation. No obstructive changes are seen.  Degenerative changes of lumbar spine and scoliosis concave to the left are seen. No mass or abnormal calcifications are seen. IMPRESSION: Mild constipation. Electronically Signed   By: Inez Catalina M.D.   On: 05/08/2019 14:33    Medications:   . cholecalciferol  5,000 Units Oral Daily  . colchicine  0.3 mg Oral BID  . enoxaparin (LOVENOX) injection  40 mg Subcutaneous Q24H  . ibuprofen  600 mg Oral BID  . melatonin  9 mg Oral QHS  . pantoprazole  20 mg Oral Daily  . vitamin A  10,000 Units Oral Daily  . vitamin B-12  100 mcg Oral Daily   Continuous Infusions:    LOS: 1 day   Geradine Girt  [facility]   How to contact the [facility] Attending or Consulting provider [place] or covering provider during after hours [place], for this patient?  1. Check the care team in [facility] and look for a) attending/consulting [facility] provider listed and b) the [facility] team listed 2. Log into [email or web address] and use [place]'s universal password to access. If you do not have the password, please contact the [facility] operator. 3. Locate the [facility] provider you are looking for under [facility] and page to a number that you can be directly reached. 4. If you still have difficulty reaching the provider, please page the [facility] (Director on Call) for the Hospitalists listed on amion for assistance.  05/09/2019, 4:29 PM

## 2019-05-09 NOTE — Progress Notes (Signed)
Progress Note  Patient Name: [facility] Date of Encounter: 05/09/2019  Primary Cardiologist: Elouise Munroe, MD    Subjective   *64 year old female with a history of chronic hyponatremia due to polydipsia.  She has a history of seizures due to hyponatremia.  We were asked to see her for an episode of pericarditis.  Echocardiogram revealed a small to moderate sized pericardial effusion with no evidence of tamponade.  She was started on colchicine, ibuprofen.  She developed a little red-tinged to her stool.  Is currently being checked for blood.  She still having some pleuritic chest pain.  Although it is significant better.  Inpatient Medications    Scheduled Meds: . cholecalciferol  5,000 Units Oral Daily  . colchicine  0.3 mg Oral BID  . enoxaparin (LOVENOX) injection  40 mg Subcutaneous Q24H  . melatonin  9 mg Oral QHS  . pantoprazole  20 mg Oral Daily  . vitamin A  10,000 Units Oral Daily  . vitamin B-12  100 mcg Oral Daily   Continuous Infusions:  PRN Meds: acetaminophen **OR** acetaminophen, HYDROcodone-acetaminophen, zolpidem   Vital Signs    Vitals:   05/08/19 1945 05/08/19 2314 05/09/19 0525 05/09/19 0756  BP:  127/77 139/89 (!) 144/95  Pulse:  87 87 96  Resp:  17 16   Temp: 98.1 F (36.7 C) 99.7 F (37.6 C) 98.1 F (36.7 C)   TempSrc: Oral Oral Oral   SpO2:  94% 95%   Weight:      Height:        Intake/Output Summary (Last 24 hours) at 05/09/2019 1139 Last data filed at 05/09/2019 0800 Gross per 24 hour  Intake 240 ml  Output -  Net 240 ml   Last 3 Weights 05/06/2019 04/30/2019 01/10/2017  Weight (lbs) 138 lb 14.2 oz 140 lb 145 lb 1.6 oz  Weight (kg) 63 kg 63.504 kg 65.817 kg      Telemetry     - Personally Reviewed  ECG     - Personally Reviewed  Physical Exam   GEN:  Middle-aged female, no acute distress Neck: No JVD Cardiac: RRR, soft pericardial rub Respiratory: Clear to auscultation bilaterally. GI: Soft, nontender,  non-distended  MS: No edema; No deformity. Neuro:  Nonfocal  Psych: Normal affect   Labs    High Sensitivity Troponin:   Recent Labs  Lab 04/30/19 1231 04/30/19 1429 05/06/19 1211 05/06/19 1417  TROPONINIHS 8 9 24* 20*      Chemistry Recent Labs  Lab 05/07/19 0548 05/08/19 0513 05/09/19 0745  NA 128* 131* 130*  K 3.9 4.1 3.9  CL 94* 97* 96*  CO2 25 25 23   GLUCOSE 92 90 91  BUN 7* 8 5*  CREATININE 0.67 0.59 0.60  CALCIUM 8.2* 8.2* 8.4*  GFRNONAA >60 >60 >60  GFRAA >60 >60 >60  ANIONGAP 9 9 11      Hematology Recent Labs  Lab 05/06/19 1211 05/08/19 0513 05/09/19 0745  WBC 6.6 2.9* 4.1  RBC 3.50* 3.12* 3.68*  HGB 10.3* 9.2* 10.7*  HCT 31.0* 27.8* 32.0*  MCV 88.6 89.1 87.0  MCH 29.4 29.5 29.1  MCHC 33.2 33.1 33.4  RDW 12.9 13.1 12.9  PLT 387 354 459*    BNPNo results for input(s): BNP, PROBNP in the last 168 hours.   DDimer No results for input(s): DDIMER in the last 168 hours.   Radiology    DG Chest 2 View  Result Date: 05/08/2019 CLINICAL DATA:  Abdominal distension EXAM:  CHEST - 2 VIEW COMPARISON:  05/06/2019 FINDINGS: Cardiac shadow is enlarged. Bilateral pleural effusions are noted left greater than right. Left basilar infiltrate is noted in the retrocardiac region. No new focal abnormality is seen. IMPRESSION: Stable appearance of the chest when compared with the previous exam. Electronically Signed   By: Inez Catalina M.D.   On: 05/08/2019 14:32   DG Abd 1 View  Result Date: 05/08/2019 CLINICAL DATA:  Abdominal distension EXAM: ABDOMEN - 1 VIEW COMPARISON:  None. FINDINGS: Scattered large and small bowel gas is noted. Fecal material is noted throughout the colon consistent with a mild degree of constipation. No obstructive changes are seen. Degenerative changes of lumbar spine and scoliosis concave to the left are seen. No mass or abnormal calcifications are seen. IMPRESSION: Mild constipation. Electronically Signed   By: Inez Catalina M.D.   On:  05/08/2019 14:33    Cardiac Studies     Patient Profile     64 y.o. female with pericarditis.  Assessment & Plan    1.  Acute pericarditis: Patient has small to moderate sized pericardial effusion and has a very slight rub on exam.  He does not appear to be acutely ill. She seems to be getting some relief from the low dose colchicine.  She had a lot of relief with the ibuprofen but this has been held temporarily to make sure she is not having any bleeding.  Hopefully will be able to restart the nonsteroidal anti-inflammatory agent today.  Dr. Eliseo Squires will be assessing her for other rheumatologic issuesShe has rheumatology appointment in several weeks.      For questions or updates, please contact [place] Please consult [email or web address] for contact info under        Signed, Mertie Moores, MD  05/09/2019, 11:39 AM

## 2019-05-10 DIAGNOSIS — I313 Pericardial effusion (noninflammatory): Secondary | ICD-10-CM | POA: Diagnosis not present

## 2019-05-10 DIAGNOSIS — I318 Other specified diseases of pericardium: Secondary | ICD-10-CM | POA: Diagnosis not present

## 2019-05-10 LAB — BASIC METABOLIC PANEL
Anion gap: 9 (ref 5–15)
BUN: 9 mg/dL (ref 8–23)
CO2: 24 mmol/L (ref 22–32)
Calcium: 8.5 mg/dL — ABNORMAL LOW (ref 8.9–10.3)
Chloride: 94 mmol/L — ABNORMAL LOW (ref 98–111)
Creatinine, Ser: 0.61 mg/dL (ref 0.44–1.00)
GFR calc Af Amer: >60 mL/min (ref >60)
GFR calc non Af Amer: >60 mL/min (ref >60)
Glucose, Bld: 94 mg/dL (ref 70–99)
Potassium: 4.2 mmol/L (ref 3.5–5.1)
Sodium: 127 mmol/L — ABNORMAL LOW (ref 135–145)

## 2019-05-10 LAB — CBC
HCT: 29.9 % — ABNORMAL LOW (ref 36.0–46.0)
Hemoglobin: 10 g/dL — ABNORMAL LOW (ref 12.0–15.0)
MCH: 29.2 pg (ref 26.0–34.0)
MCHC: 33.4 g/dL (ref 30.0–36.0)
MCV: 87.2 fL (ref 80.0–100.0)
Platelets: 428 10*3/uL — ABNORMAL HIGH (ref 150–400)
RBC: 3.43 MIL/uL — ABNORMAL LOW (ref 3.87–5.11)
RDW: 12.8 % (ref 11.5–15.5)
WBC: 3 10*3/uL — ABNORMAL LOW (ref 4.0–10.5)
nRBC: 0 % (ref 0.0–0.2)

## 2019-05-10 LAB — OCCULT BLOOD X 1 CARD TO LAB, STOOL: Fecal Occult Bld: POSITIVE — AB

## 2019-05-10 MED ORDER — PREDNISONE 20 MG PO TABS
20.0000 mg | ORAL_TABLET | Freq: Every day | ORAL | Status: DC
  Administered 2019-05-11: 20 mg via ORAL
  Filled 2019-05-10: qty 1

## 2019-05-10 MED ORDER — PREDNISONE 20 MG PO TABS
20.0000 mg | ORAL_TABLET | Freq: Once | ORAL | Status: AC
  Administered 2019-05-10: 20 mg via ORAL
  Filled 2019-05-10: qty 1

## 2019-05-10 MED ORDER — PANTOPRAZOLE SODIUM 40 MG PO TBEC
40.0000 mg | DELAYED_RELEASE_TABLET | Freq: Every day | ORAL | Status: DC
  Administered 2019-05-11: 40 mg via ORAL
  Filled 2019-05-10: qty 1

## 2019-05-10 MED ORDER — SODIUM CHLORIDE 1 G PO TABS
1.0000 g | ORAL_TABLET | Freq: Every day | ORAL | Status: DC
  Administered 2019-05-10 – 2019-05-11 (×2): 1 g via ORAL
  Filled 2019-05-10 (×2): qty 1

## 2019-05-10 NOTE — Progress Notes (Signed)
Progress Note    Victoria Stokes  [id number] DOB: [birth year]  DOA: 05/06/2019 PCP: Kathyrn Lass, MD    Brief Narrative:     Medical records reviewed and are as summarized below:  Victoria Stokes is an 64 y.o. 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  Chest pain has been sharp-like, 5-6 over 10, worsening with lying down and deep breath, and somewhat relieved with sitting up, and she denied any cough.  Told her chart her temperature was 100.5 last week, she has been taking Advil every day, so daughter concerned about her actual fever probably was higher.   She came to ED last week, CT angiogram was negative for PE but mild bilateral pleural effusion.  She went to her PCP this week, was prescribed with amoxicillin, for possible pneumonia, and her PCP also suspected that patient may have " pericarditis".  Meanwhile, during the last few weeks, patient started to feel short of breath associated with exertion, no orthopnea, denies any swelling.  And admitted that she few thirsty all the time and drinks about 1 to 1.5 gallons of fluid every day.   Assessment/Plan:   Active Problems:   Hyponatremia   Community acquired pneumonia   Pericardial effusion   Pericarditis     Chest pain/exertional dyspnea with pericardial effusion: pericarditis -troponin  flat -Echo: EF of 60 to 65% with no regional wall motion abnormalities.  No evidence of tamponade but there is a small to moderate pericardial effusion in the posterior and lateral to the left ventricle and anterior to the right ventricle -cardiology consult appreciated -Patient does have some some small pleural effusions left greater than right: Still does not appear to be enough to get a thoracentesis and laboratory data from -Patient's lung sound better after incentive spirometry -Plan to  continue colchicine for 3 months -Patient will also need echo in 2 weeks to ensure stabilization  Anemia -Appears to be of chronic inflammation -heme positive -outpatient GI follow up  Possible autoimmune disorder -Patient reported last summer and autumn she had to prolonged episodes of migrating joint swelling and pain in her fingers wrist, elbow and knees and low grade fever but no rash.   -She went to Dr. Addison Lank who did some work-up: see below -Her PCP recommended her to go see a rheumatologist, which she has not able to set up until April: I have called Dr. Melissa Noon office to see if patient can be seen sooner as we suspect her pericardial effusion is related to inflammatory/autoimmune issues -I have reviewed her labs from outside facility including: Labs from 8/20 and 11/20 with a negative rheumatoid factor.  Patient had 3 ANAs drawn all 3 were positive with the patterns of 1: 160--> 1: 1280--> 1: 640 -will order further work-up including the following labs: Antiextractable nuclear antigens: Including Smith, RNP, SSA/SSB Will reach out to [facility] rheumatology to see if patient can get quicker follow-up with them -as weill need to hold ibuprofen will do trial of steroids at 20 mg daily for a few days to then drop to 15 mg and taper from there  Acute on Chronic hyponatremia, -Serum osmolality low, urine osmolality normal, sodium urine: < 10 -Patient does states she drinks a lot of water so could be from water [place] mania -Continue to monitor -did well on salt tabs  MGUS -Patient F/U outpatient  with oncologist at [facility].   -Last visit was in October 2020: Dr. [name] is a 64 y.o. female with IgG kappa plasma cell dyscrasia and xrays suspicious for lytic lesions but a PET/CT without any evidence of FDG-avid osseous disease, who presents in follow up. Disease is most consistent with MGUS/early smoldering MM.   When she presented to our clinic, her M-spike had improved, she had a low  burden of disease in her marrow, no dysregulation of her free light chains, and minimal proteinuria. She also has no anemia, hypercalcemia, or renal dysfunction. The results of the PET/CT showed there was no evidence of FDG-avid osseous disease, surprisingly, there isn't even uptake around the T4 fracture, making me question the chronicity of this lesion. Her case was discussed with Drs. Hopkins and Brady who presented her case at the Spine Metastasis Tumor Board. Following this, their recommendation was prospective monitoring. 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  In February, she had a small progression in M-spike and new dysregulation of light chains which is consistent with MGUS disease progression. This may have been as a result of a change in her lifestyle. Following resuming a strict keto diet, her labs improved. This past Summer she suffered some type of viral insult creating significant inflammatory stressors in her body, her platelet elevation is likely still a result of reactivity. I am pleased to see her Mspike is improved but FLC slightly disregulated, possibly as a result. Will CTM and encourage healthy lifestyle.   CAP -Was started on amoxicillin as an outpatient -Changed over to ceftriaxone/azithromycin -Procalcitonin less than 0.1, doubt infectious process so will DC antibiotics  Abdominal distention -Bowel regimen -Suspect due to constipation  Decreased WBC count -doubt from colchicine but will need monitoring  Family Communication/Anticipated D/C date and plan/Code Status    Code Status: Full Code.  Family Communication: Daughter at bedside Disposition Plan: Pending improvement of symptoms and ability to get patient a sooner appointment with rheumatology I have reached out to Dr. Melissa Noon office to see if patient can be worked in sooner.  Await callback  Medical Consultants:    Cardiology     Subjective:   Had an episode of  intense fatigue earlier today but otherwise has been able to walk Deep breaths still or uncomfortable but patient is able to use the incentive spirometer  Objective:    Vitals:   05/09/19 2300 05/10/19 0434 05/10/19 0808 05/10/19 1240  BP: 118/77 123/85 120/85 133/85  Pulse: 83 86 88 96  Resp: 18 18  16   Temp: 97.7 F (36.5 C) 98.2 F (36.8 C)  98 F (36.7 C)  TempSrc: Oral Oral  Oral  SpO2: 95% 93%  100%  Weight:      Height:        Intake/Output Summary (Last 24 hours) at 05/10/2019 1617 Last data filed at 05/10/2019 1253 Gross per 24 hour  Intake 960 ml  Output -  Net 960 ml   Filed Weights   05/06/19 1403  Weight: 63 kg    Exam: In bed, speaking without shortness of breath Mild diminished breath sounds at the bases bilateral, no wheezing Regular rate and rhythm No lower extremity edema No swelling in her joints or joint pain   Data Reviewed:   I have personally reviewed following labs and imaging studies:  Labs: Labs show the following:   Basic Metabolic Panel: Recent Labs  Lab 05/06/19 1211 05/06/19 1211 05/07/19 0548 05/07/19 0548  05/08/19 0513 05/08/19 0513 05/09/19 0745 05/10/19 0242  NA 125*  --  128*  --  131*  --  130* 127*  K 4.4   < > 3.9   < > 4.1   < > 3.9 4.2  CL 89*  --  94*  --  97*  --  96* 94*  CO2 23  --  25  --  25  --  23 24  GLUCOSE 109*  --  92  --  90  --  91 94  BUN 7*  --  7*  --  8  --  5* 9  CREATININE 0.69  --  0.67  --  0.59  --  0.60 0.61  CALCIUM 8.4*  --  8.2*  --  8.2*  --  8.4* 8.5*   < > = values in this interval not displayed.   GFR Estimated Creatinine Clearance: 70.9 mL/min (by C-G formula based on SCr of 0.61 mg/dL). Liver Function Tests: No results for input(s): AST, ALT, ALKPHOS, BILITOT, PROT, ALBUMIN in the last 168 hours. No results for input(s): LIPASE, AMYLASE in the last 168 hours. No results for input(s): AMMONIA in the last 168 hours. Coagulation profile No results for input(s): INR, PROTIME  in the last 168 hours.  CBC: Recent Labs  Lab 05/06/19 1211 05/08/19 0513 05/09/19 0745 05/10/19 0242  WBC 6.6 2.9* 4.1 3.0*  NEUTROABS  --  2.3  --   --   HGB 10.3* 9.2* 10.7* 10.0*  HCT 31.0* 27.8* 32.0* 29.9*  MCV 88.6 89.1 87.0 87.2  PLT 387 354 459* 428*   Cardiac Enzymes: No results for input(s): CKTOTAL, CKMB, CKMBINDEX, TROPONINI in the last 168 hours. BNP (last 3 results) No results for input(s): PROBNP in the last 8760 hours. CBG: No results for input(s): GLUCAP in the last 168 hours. D-Dimer: No results for input(s): DDIMER in the last 72 hours. Hgb A1c: No results for input(s): HGBA1C in the last 72 hours. Lipid Profile: No results for input(s): CHOL, HDL, LDLCALC, TRIG, CHOLHDL, LDLDIRECT in the last 72 hours. Thyroid function studies: No results for input(s): TSH, T4TOTAL, T3FREE, THYROIDAB in the last 72 hours.  Invalid input(s): FREET3 Anemia work up: Recent Labs    05/09/19 0745  FERRITIN 287  TIBC 195*  IRON 22*   Sepsis Labs: Recent Labs  Lab 05/06/19 1211 05/06/19 1427 05/06/19 1654 05/08/19 0513 05/09/19 0745 05/10/19 0242  PROCALCITON  --   --   --  <0.10  --   --   WBC 6.6  --   --  2.9* 4.1 3.0*  LATICACIDVEN  --  1.4 1.2  --   --   --     Microbiology Recent Results (from the past 240 hour(s))  SARS CORONAVIRUS 2 (TAT 6-24 HRS)     Status: None   Collection Time: 05/06/19  1:39 PM  Result Value Ref Range Status   SARS Coronavirus 2 NEGATIVE NEGATIVE Final    Comment: (NOTE) SARS-CoV-2 target nucleic acids are NOT DETECTED. The SARS-CoV-2 RNA is generally detectable in upper and lower respiratory specimens during the acute phase of infection. Negative results do not preclude SARS-CoV-2 infection, do not rule out co-infections with other pathogens, and should not be used as the sole basis for treatment or other patient management decisions. Negative results must be combined with clinical observations, patient history, and  epidemiological information. The expected result is Negative. Fact Sheet for Patients: SugarRoll.be Fact Sheet for Healthcare Providers: https://www.woods-mathews.com/  This test is not yet approved or cleared by the [place] and  has been authorized for detection and/or diagnosis of SARS-CoV-2 by FDA under an Emergency Use Authorization (EUA). This EUA will remain  in effect (meaning this test can be used) for the duration of the COVID-19 declaration under Section 56 4(b)(1) of the Act, 21 U.S.C. section 360bbb-3(b)(1), unless the authorization is terminated or revoked sooner. Performed at [facility], [place] [address], [place], [place] [address]   Blood culture (routine x 2)     Status: None (Preliminary result)   Collection Time: 05/06/19  2:14 PM   Specimen: BLOOD  Result Value Ref Range Status   Specimen Description BLOOD RIGHT ANTECUBITAL  Final   Special Requests   Final    BOTTLES DRAWN AEROBIC ONLY Blood Culture results may not be optimal due to an inadequate volume of blood received in culture bottles   Culture   Final    NO GROWTH 4 DAYS Performed at [facility], [place] [address] [place], [place] [address]    Report Status PENDING  Incomplete  Blood culture (routine x 2)     Status: None (Preliminary result)   Collection Time: 05/06/19  2:17 PM   Specimen: BLOOD  Result Value Ref Range Status   Specimen Description BLOOD LEFT ANTECUBITAL  Final   Special Requests   Final    BOTTLES DRAWN AEROBIC AND ANAEROBIC Blood Culture results may not be optimal due to an inadequate volume of blood received in culture bottles   Culture   Final    NO GROWTH 4 DAYS Performed at [facility], [place] [address] [place], [place] [address]    Report Status PENDING  Incomplete    Procedures and diagnostic studies:  No results found.  Medications:   . cholecalciferol  5,000 Units Oral Daily  . colchicine  0.3 mg  Oral BID  . melatonin  9 mg Oral QHS  . [START ON 05/11/2019] pantoprazole  40 mg Oral Daily  . [START ON 05/11/2019] predniSONE  20 mg Oral Q breakfast  . sodium chloride  1 g Oral Daily  . vitamin A  10,000 Units Oral Daily  . vitamin B-12  100 mcg Oral Daily   Continuous Infusions:    LOS: 2 days   Geradine Girt  [facility]   How to contact the [facility] Attending or Consulting provider [place] or covering provider during after hours [place], for this patient?  1. Check the care team in [facility] and look for a) attending/consulting [facility] provider listed and b) the [facility] team listed 2. Log into [email or web address] and use [place]'s universal password to access. If you do not have the password, please contact the hospital operator. 3. Locate the [facility] provider you are looking for under [facility] and page to a number that you can be directly reached. 4. If you still have difficulty reaching the provider, please page the [facility] (Director on Call) for the Hospitalists listed on amion for assistance.  05/10/2019, 4:17 PM

## 2019-05-10 NOTE — Progress Notes (Signed)
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  She has a history of seizures due to hyponatremia.  We were asked to see her for an episode of pericarditis.  Echocardiogram revealed a small to moderate sized pericardial effusion with no evidence of tamponade.  She was started on colchicine, ibuprofen.  She developed a little red-tinged to her stool.  Is currently being checked for blood.  She still having some pleuritic chest pain.  Although it is significant better.  Her stool guiac test have been positive.   Hb is stable at 10.0 Remains hyponatremic with Na level of 127.    Inpatient Medications    Scheduled Meds: . cholecalciferol  5,000 Units Oral Daily  . colchicine  0.3 mg Oral BID  . melatonin  9 mg Oral QHS  . pantoprazole  20 mg Oral Daily  . sodium chloride  1 g Oral Daily  . vitamin A  10,000 Units Oral Daily  . vitamin B-12  100 mcg Oral Daily   Continuous Infusions:  PRN Meds: acetaminophen **OR** acetaminophen, HYDROcodone-acetaminophen, zolpidem   Vital Signs    Vitals:   05/09/19 2300 05/10/19 0434 05/10/19 0808 05/10/19 1240  BP: 118/77 123/85 120/85 133/85  Pulse: 83 86 88 96  Resp: 18 18  16   Temp: 97.7 F (36.5 C) 98.2 F (36.8 C)  98 F (36.7 C)  TempSrc: Oral Oral  Oral  SpO2: 95% 93%  100%  Weight:      Height:        Intake/Output Summary (Last 24 hours) at 05/10/2019 1241 Last data filed at 05/09/2019 2300 Gross per 24 hour  Intake 960 ml  Output --  Net 960 ml   Last 3 Weights 05/06/2019 04/30/2019 01/10/2017  Weight (lbs) 138 lb 14.2 oz 140 lb 145 lb 1.6 oz  Weight (kg) 63 kg 63.504 kg 65.817 kg      Telemetry     NSR - Personally Reviewed  ECG     - Personally Reviewed  Physical Exam   GEN:  Middle-aged female, no acute distress Neck: No JVD Cardiac:  RRR, soft pericardial rub Respiratory: Clear to auscultation bilaterally. GI: Soft, nontender, non-distended  MS: No edema; No deformity. Neuro:  Nonfocal  Psych: Normal affect   Labs    High Sensitivity Troponin:   Recent Labs  Lab 04/30/19 1231 04/30/19 1429 05/06/19 1211 05/06/19 1417  TROPONINIHS 8 9 24* 20*      Chemistry Recent Labs  Lab 05/08/19 0513 05/09/19 0745 05/10/19 0242  NA 131* 130* 127*  K 4.1 3.9 4.2  CL 97* 96* 94*  CO2 25 23 24   GLUCOSE 90 91 94  BUN 8 5* 9  CREATININE 0.59 0.60 0.61  CALCIUM 8.2* 8.4* 8.5*  GFRNONAA >60 >60 >60  GFRAA >60 >60 >60  ANIONGAP 9 11 9      Hematology Recent Labs  Lab 05/08/19 0513 05/09/19 0745 05/10/19 0242  WBC 2.9* 4.1 3.0*  RBC 3.12* 3.68* 3.43*  HGB 9.2* 10.7* 10.0*  HCT 27.8* 32.0* 29.9*  MCV 89.1 87.0 87.2  MCH 29.5 29.1 29.2  MCHC 33.1 33.4 33.4  RDW 13.1 12.9 12.8  PLT 354 459* 428*    BNPNo results for input(s): BNP, PROBNP in the last 168 hours.   DDimer No results for input(s): DDIMER in the  last 168 hours.   Radiology    DG Chest 2 View  Result Date: 05/08/2019 CLINICAL DATA:  Abdominal distension EXAM: CHEST - 2 VIEW COMPARISON:  05/06/2019 FINDINGS: Cardiac shadow is enlarged. Bilateral pleural effusions are noted left greater than right. Left basilar infiltrate is noted in the retrocardiac region. No new focal abnormality is seen. IMPRESSION: Stable appearance of the chest when compared with the previous exam. Electronically Signed   By: Inez Catalina M.D.   On: 05/08/2019 14:32   DG Abd 1 View  Result Date: 05/08/2019 CLINICAL DATA:  Abdominal distension EXAM: ABDOMEN - 1 VIEW COMPARISON:  None. FINDINGS: Scattered large and small bowel gas is noted. Fecal material is noted throughout the colon consistent with a mild degree of constipation. No obstructive changes are seen. Degenerative changes of lumbar spine and scoliosis concave to the left are seen. No mass or abnormal calcifications  are seen. IMPRESSION: Mild constipation. Electronically Signed   By: Inez Catalina M.D.   On: 05/08/2019 14:33    Cardiac Studies     Patient Profile     64 y.o. female with pericarditis.  Assessment & Plan    1.  Acute pericarditis: Patient has small to moderate sized pericardial effusion and has a very slight rub on exam.  He does not appear to be acutely ill.  she is on low dose prednisone and cholchicine . Marland Kitchen  Stools have been positive for Hb.    Holding Ibuprofen for now   she is stable for DC  [facility] will sign off.   Medication Recommendations:  Cont current meds  Other recommendations (labs, testing, etc):   Follow up as an outpatient:  With Dr. Margaretann Loveless in 2 weeks with echo   For questions or updates, please contact [facility] Please consult [email or web address] for contact info under        Signed, Mertie Moores, MD  05/10/2019, 12:41 PM

## 2019-05-11 DIAGNOSIS — J90 Pleural effusion, not elsewhere classified: Secondary | ICD-10-CM

## 2019-05-11 DIAGNOSIS — J9811 Atelectasis: Secondary | ICD-10-CM

## 2019-05-11 DIAGNOSIS — E871 Hypo-osmolality and hyponatremia: Secondary | ICD-10-CM | POA: Diagnosis not present

## 2019-05-11 DIAGNOSIS — I318 Other specified diseases of pericardium: Secondary | ICD-10-CM | POA: Diagnosis not present

## 2019-05-11 DIAGNOSIS — I313 Pericardial effusion (noninflammatory): Secondary | ICD-10-CM | POA: Diagnosis not present

## 2019-05-11 LAB — BASIC METABOLIC PANEL
Anion gap: 11 (ref 5–15)
BUN: 8 mg/dL (ref 8–23)
CO2: 25 mmol/L (ref 22–32)
Calcium: 8.7 mg/dL — ABNORMAL LOW (ref 8.9–10.3)
Chloride: 97 mmol/L — ABNORMAL LOW (ref 98–111)
Creatinine, Ser: 0.64 mg/dL (ref 0.44–1.00)
GFR calc Af Amer: >60 mL/min (ref >60)
GFR calc non Af Amer: >60 mL/min (ref >60)
Glucose, Bld: 87 mg/dL (ref 70–99)
Potassium: 3.6 mmol/L (ref 3.5–5.1)
Sodium: 133 mmol/L — ABNORMAL LOW (ref 135–145)

## 2019-05-11 LAB — CULTURE, BLOOD (ROUTINE X 2)
Culture: NO GROWTH
Culture: NO GROWTH

## 2019-05-11 LAB — CBC
HCT: 31.4 % — ABNORMAL LOW (ref 36.0–46.0)
Hemoglobin: 10.5 g/dL — ABNORMAL LOW (ref 12.0–15.0)
MCH: 29.4 pg (ref 26.0–34.0)
MCHC: 33.4 g/dL (ref 30.0–36.0)
MCV: 88 fL (ref 80.0–100.0)
Platelets: 485 10*3/uL — ABNORMAL HIGH (ref 150–400)
RBC: 3.57 MIL/uL — ABNORMAL LOW (ref 3.87–5.11)
RDW: 12.8 % (ref 11.5–15.5)
WBC: 3.9 10*3/uL — ABNORMAL LOW (ref 4.0–10.5)
nRBC: 0 % (ref 0.0–0.2)

## 2019-05-11 LAB — C4 COMPLEMENT: Complement C4, Body Fluid: 21 mg/dL (ref 12–38)

## 2019-05-11 LAB — C3 COMPLEMENT: C3 Complement: 102 mg/dL (ref 82–167)

## 2019-05-11 LAB — ANTIEXTRACTABLE NUCLEAR AG
ENA SM Ab Ser-aCnc: <0.2 AI (ref 0.0–0.9)
Ribonucleic Protein: <0.2 AI (ref 0.0–0.9)

## 2019-05-11 MED ORDER — KETOROLAC TROMETHAMINE 15 MG/ML IJ SOLN
15.0000 mg | Freq: Once | INTRAMUSCULAR | Status: AC
  Administered 2019-05-11: 15 mg via INTRAVENOUS
  Filled 2019-05-11: qty 1

## 2019-05-11 MED ORDER — HYDROCODONE-ACETAMINOPHEN 5-325 MG PO TABS: 1.0000 | ORAL_TABLET | Freq: Four times a day (QID) | ORAL | 0 refills | Status: AC | PRN

## 2019-05-11 MED ORDER — SODIUM CHLORIDE 1 G PO TABS: 1.0000 g | ORAL_TABLET | Freq: Every day | ORAL | 0 refills | Status: AC

## 2019-05-11 MED ORDER — PANTOPRAZOLE SODIUM 40 MG PO TBEC: 40.0000 mg | DELAYED_RELEASE_TABLET | Freq: Every day | ORAL | 0 refills | Status: DC

## 2019-05-11 MED ORDER — COLCHICINE 0.6 MG PO TABS: 0.3000 mg | ORAL_TABLET | Freq: Two times a day (BID) | ORAL | 2 refills | Status: DC

## 2019-05-11 MED ORDER — PREDNISONE 5 MG PO TABS: ORAL_TABLET | ORAL | 0 refills | Status: DC

## 2019-05-11 NOTE — Progress Notes (Signed)
RN gave pt and daughter discharge instructions and they both stated understanding. IV has been removed, belongings have been packed and new medications have been escribed to pt home pharmacy for pickup.

## 2019-05-11 NOTE — Consult Note (Signed)
NAME:  Victoria Stokes, MRN:  [id number], DOB:  1955/07/07, LOS: 3 ADMISSION DATE:  05/06/2019, CONSULTATION DATE:  05/11/2019 REFERRING MD:  Dr. Eliseo Squires, CHIEF COMPLAINT:  SOB/ b/l pleural effusions  Brief History   64 year old female presenting with chest pain, fevers, and shortness of breath found to have pericardial effusion being treated for acute pericarditis.  Noted on CXR to have enlarging b/l effusions.  PCCM consulted for further evaluation and recommendations.   History of present illness    64 year old female with past medical history of chronic hyponatremia due to polydipsia/ water potomania, MGUS/ early smoldering MM being followed by [facility], and remote history of seizures ( in the setting of hyponatremia) who presented for chest pain, fever, and progressive shortness of breath.   Patient is a never smoker.  She works as an Forensic psychologist.  She states she thinks she might have had COVID last March because she was ill for 10 days.  Then again in July, she felt ill for awhile with fatigue and migrating joint pain and swelling.  Prior to all this, she reports is normally a healthy person at baseline.  Reports history of autoimmune issues with her siblings- ulcerative colitis and other non-specific conditions.   She states she started feeling unwell at the end of January and has just progressed- fatigue, intermittent low grade temps, chest pain, and exertional dyspnea.  Initially evaluated on 3/18 for vomiting, fever, fatigue and worsening chest pain.  Found to be hyponatremic at 122 and CXR consistent with pleural effusions.  CTA chest/ abd/ pelvis was negative for PE but noted trace bilateral pleural effusions and trace pericardial effusion.  Her repeat Na was improved and she was sent home.  She then followed up with her PCP who started her on antibiotics for possible pneumonia and follow-up Na 130 noted.   Again presented back 3/24 for fever, productive cough- at times, exertional SOB and worse with  inspiration, fatigue and positional chest pain.  She had been taking tylenol and advil which helped tremendously.  No orthopnea or extremity swelling.  Hemodynamically stable and afebrile on admit.  Workup notable for Na 125, CXR with prominent cardiac silhouette and increasing pleural effusions and left retrocardiac opacity.  She was started on ceftriaxone and azithromax and admitted to [facility].  PCT returned at 0.1, therefore antibiotics stopped. She was evaluated by cardiology for possible pericarditis/ pericardial effusion.  EKG sinus rhythm with non specific Twave changes.  Echocardiogram revealed a small to moderate sized pericardial effusion with no evidence of tamponade.  Troponin hs trend flat.  CRP elevated 16.2 with normal sed rate.    She was started on colchicine, ibuprofen.  Since admit, her workup notable for positive ANA and positive RA factor.  Of note, she had a positive ANA and negative RF factor in August and November 2020 per chart review with ANA patterns 1: 160--> 1: 1280--> 1: 640.  Admit WBC 6.6, since progressive leukopenia, stable Hgb, + heme in stool, therefore NSAID stopped and continued on prednisone and cholchicine for pericarditis.  Overnight 3/28, she had worsening chest pain with shortness of breath patient attributes to her position and pain medicine wearing off.  Since taking her pain medication on a schedule today, reports much improved shortness of breath.  PCCM asked to consult for further evaluation and recommendations reqarding her bilateral pleural effusions.   Past Medical History  MGUS/ early smoldering MM being followed by [facility], chronic hyponatremia due to polydipsia/ water potomania,  seizure (related to hyponatremia)  Non smoker, no drug hx  Significant Hospital Events   3/24 Admitted   Consults:  Cardiology   Procedures:   Significant Diagnostic Tests:   3/18 CTA chest/ abd/ pelvis >> 1. No evidence of acute pulmonary embolism. 2. Trace bilateral pleural  effusions, left greater than right. 3. Age-indeterminate compression fracture of the T4 vertebral body, favored to be chronic. 4. Periportal edema with mild gallbladder wall thickening, which is nonspecific. Findings may be secondary to the patient's volume status or underlying hepatocellular disease. Correlation with laboratory studies is recommended. 5. Trace pericardial effusion.  3/24 CXR 2v >> 1. The cardiac silhouette appears more prominent the interval. The difference may be technical in nature. 2. Small but increasing effusions. 3. Left retrocardiac opacity could represent atelectasis, pneumonia, or aspiration. Recommend clinical correlation. Recommend short-term follow-up imaging to ensure resolution.  3/24 TTE >> 1. Left ventricular ejection fraction, by estimation, is 60 to 65%. The left ventricle has normal function. The left ventricle has no regional wall motion abnormalities. Left ventricular diastolic parameters were normal.  2. Right ventricular systolic function is normal. The right ventricular size is normal. There is normal pulmonary artery systolic pressure.  3. No evidence of tamponade small to moderate. The pericardial effusion is posterior and lateral to the left ventricle and anterior to the right ventricle.  4. The mitral valve is normal in structure. Trivial mitral valve regurgitation. No evidence of mitral stenosis.  5. The aortic valve is tricuspid. Aortic valve regurgitation is trivial. Mild to moderate aortic valve sclerosis/calcification is present, without any evidence of aortic stenosis.  6. The inferior vena cava is dilated in size with >50% respiratory variability, suggesting right atrial pressure of 8 mmHg.   3/26 CXR 2v >> Stable appearance of the chest when compared with the previous exam  Micro Data:  3/24 SARS 2 >> neg 3/24 BC x 2 >>neg  Antimicrobials:  3/24 azithro >> 3/26 3/24 ceftriaxone >> 3/26  Interim history/subjective:  No pain or  SOB at this time   Objective   Blood pressure 124/84, pulse 91, temperature 98.2 F (36.8 C), temperature source Oral, resp. rate 17, height 5\' 7"  (1.702 m), weight 63 kg, SpO2 96 %.        Intake/Output Summary (Last 24 hours) at 05/11/2019 1338 Last data filed at 05/10/2019 1744 Gross per 24 hour  Intake 360 ml  Output --  Net 360 ml   Filed Weights   05/06/19 1403  Weight: 63 kg   Examination: General:  Adult female sitting in bed in NAD, daughter at bedside.  HEENT: MM pink/moist Neuro: Alert, oriented, MAE CV: rr, no murmur, no appreciable rub PULM:  Non labored, speaking full sentences, no cough, clear breath sounds anteriorly, bibasilar dry rales   Extremities: warm/dry, no edema  Skin: no rashes    Left posterior lung view   Right posterior lung view (appears overestimated on this image).    Resolved Hospital Problem list    Assessment & Plan:   Dyspnea, Bilateral pleural effusions, L> R ( increased from 04/30/2019 imaging), and  left retrocardiac opacity in the setting of acute pericarditis and concern for underlying autoimmune process  - left opacity likely atelectasis based on ultrasound imaging and hx given recent shallow breathing due to pain.  Effusions on bedside ultrasound not large enough for therapeutic  thoracentesis at this time.  PCT reassuring, less likely infectious process.  P:  Recommend she have follow up imaging to ensure  resolution and not enlarging effusions.  If not, would recommend a diagnostic thoracentesis Further autoimmune labs pending.  Patient to f/u outpt with rheumatology.   Patient using IS, reviewed and ongoing pulmonary hygiene Pain medicine prn to help facilitate deep breathing, prevent further atelectasis.      Remainder per primary.  Nothing further to add.  PCCM will sign off.  Please do not hesitate to call us back if we can be of any further assistance.   Labs   CBC: Recent Labs  Lab 05/06/19 1211 05/08/19 0513  05/09/19 0745 05/10/19 0242 05/11/19 0518  WBC 6.6 2.9* 4.1 3.0* 3.9*  NEUTROABS  --  2.3  --   --   --   HGB 10.3* 9.2* 10.7* 10.0* 10.5*  HCT 31.0* 27.8* 32.0* 29.9* 31.4*  MCV 88.6 89.1 87.0 87.2 88.0  PLT 387 354 459* 428* 485*    Basic Metabolic Panel: Recent Labs  Lab 05/07/19 0548 05/08/19 0513 05/09/19 0745 05/10/19 0242 05/11/19 0518  NA 128* 131* 130* 127* 133*  K 3.9 4.1 3.9 4.2 3.6  CL 94* 97* 96* 94* 97*  CO2 25 25 23 24 25   GLUCOSE 92 90 91 94 87  BUN 7* 8 5* 9 8  CREATININE 0.67 0.59 0.60 0.61 0.64  CALCIUM 8.2* 8.2* 8.4* 8.5* 8.7*   GFR: Estimated Creatinine Clearance: 70.9 mL/min (by C-G formula based on SCr of 0.64 mg/dL). Recent Labs  Lab 05/06/19 1211 05/06/19 1427 05/06/19 1654 05/08/19 0513 05/09/19 0745 05/10/19 0242 05/11/19 0518  PROCALCITON  --   --   --  <0.10  --   --   --   WBC   < >  --   --  2.9* 4.1 3.0* 3.9*  LATICACIDVEN  --  1.4 1.2  --   --   --   --    < > = values in this interval not displayed.    Liver Function Tests: No results for input(s): AST, ALT, ALKPHOS, BILITOT, PROT, ALBUMIN in the last 168 hours. No results for input(s): LIPASE, AMYLASE in the last 168 hours. No results for input(s): AMMONIA in the last 168 hours.  ABG No results found for: PHART, PCO2ART, PO2ART, HCO3, TCO2, ACIDBASEDEF, O2SAT   Coagulation Profile: No results for input(s): INR, PROTIME in the last 168 hours.  Cardiac Enzymes: No results for input(s): CKTOTAL, CKMB, CKMBINDEX, TROPONINI in the last 168 hours.  HbA1C: Hgb A1c MFr Bld  Date/Time Value Ref Range Status  05/06/2019 02:56 PM 5.5 4.8 - 5.6 % Final    Comment:    (NOTE) Pre diabetes:          5.7%-6.4% Diabetes:              >6.4% Glycemic control for   <7.0% adults with diabetes     CBG: No results for input(s): GLUCAP in the last 168 hours.  Review of Systems:   POSITIVES IN BOLD Gen: Denies fever, chills, weight change, fatigue PULM: Denies shortness of  breath, cough, sputum production, hemoptysis, wheezing CV: Denies chest pain, edema, orthopnea, palpitations GI: Denies abdominal pain, nausea, vomiting, diarrhea, hematochezia, melena, constipation, change in bowel habits Derm: Denies rash, dry skin Neuro: Denies headache, numbness, weakness, slurred speech, loss of memory or consciousness  Past Medical History  She,  has a past medical history of Low sodium levels and Seizures ([place]) (10/29/2016).   Surgical History    Past Surgical History:  Procedure Laterality Date  . CESAREAN SECTION  Social History   reports that she has never smoked. She has never used smokeless tobacco. She reports current alcohol use. She reports that she does not use drugs.   Family History   Her family history includes Breast cancer in her mother; Pancreatic cancer in her father. There is no history of Seizures.   Allergies Allergies  Allergen Reactions  . Gluten Meal Other (See Comments)    headache     Home Medications  Prior to Admission medications   Medication Sig Start Date End Date Taking? Authorizing Provider  acetaminophen (TYLENOL) 500 MG tablet Take 1,000 mg by mouth every 6 (six) hours as needed for mild pain.   Yes [provider]  amoxicillin-clavulanate (AUGMENTIN) 875-125 MG tablet Take 1 tablet by mouth 2 (two) times daily. 05/05/19  Yes [provider]  B Complex Vitamins (VITAMIN B-COMPLEX PO) Take 1 tablet by mouth daily.    Yes [provider]  Cholecalciferol (VITAMIN D PO) Take 5,000 Units by mouth daily.    Yes [provider]  CHOLINE PO Take 1 tablet by mouth daily.    Yes [provider]  Melatonin 10 MG TABS Take 10 mg by mouth at bedtime.    Yes [provider]  valACYclovir (VALTREX) 500 MG tablet Take 500 mg by mouth 2 (two) times daily as needed. Cold sores 03/11/19  Yes [provider]  VITAMIN A PO Take 1 tablet by mouth daily.    Yes [provider]  VITAMIN K PO Take 1 tablet by mouth daily.    Yes [provider]  zolpidem (AMBIEN) 5 MG tablet Take 5 mg by mouth as needed for sleep.   Yes [provider]  ALPRAZolam Duanne Moron) 0.5 MG tablet Take 2 tablets approximately 45 minutes prior to the MRI study, take a third tablet if needed. Patient not taking: Reported on 04/30/2019 12/17/16   Kathrynn Ducking, MD  LORazepam (ATIVAN) 0.5 MG tablet Take 1 tablet (0.5 mg total) by mouth every 4 (four) hours as needed for anxiety. Patient not taking: Reported on 12/10/2016 11/27/16   Ardath Sax, MD       Kennieth Rad, MSN, AGACNP-BC [facility] 05/11/2019, 3:40 PM        ;

## 2019-05-11 NOTE — Discharge Instructions (Signed)
Be sure to use a bowel regimen while taking the pain meds Outpatient referral to GI

## 2019-05-11 NOTE — Progress Notes (Signed)
Pt reported reached for her water and had a sharp right sided pain on her rib cage that scared her. She began crying and called for help. Pt stated pain lasted for couple seconds and as she calmed down, it began to fade. Vitals WDL. Denied SOB. EKG taken. Pt states pain worsens with movement. Pain meds given. Pt appears in no acute distress. Call bell within reach, will continue to monitor.

## 2019-05-11 NOTE — Discharge Summary (Signed)
Physician Discharge Summary  Victoria Stokes [id number] DOB: 03-Dec-1955 DOA: 05/06/2019  PCP: Kathyrn Lass, MD  Admit date: 05/06/2019 Discharge date: 05/11/2019  Admitted From: Home Discharge disposition: Home   Recommendations for Outpatient Follow-Up:   1. Need close follow-up with rheumatology: Pending labs: Anti-ENA  anti-Sm  anti-RNP  anti-La/anti-SS-B c3/c4 2. Slow taper of steroids 3. Cbc/BMP by Thursday 4. Repeat echo 2 weeks with cardiology follow up    Discharge Diagnosis:   Active Problems:   Hyponatremia   Community acquired pneumonia   Pericardial effusion   Pericarditis    Discharge Condition: Improved.  Diet recommendation: Regular.  Wound care: None.  Code status: Full.   History of Present Illness:   Victoria Stokes is a 64 y.o. 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  Chest pain has been sharp-like, 5-6 over 10, worsening with lying down and deep breath, and somewhat relieved with sitting up, and she denied any cough.  Told her chart her temperature was 100.5 last week, she has been taking Advil every day, so daughter concerned about her actual fever probably was higher.   She came to ED last week, CT angiogram was negative for PE but mild bilateral pleural effusion.  She went to her PCP this week, was prescribed with amoxicillin, for possible pneumonia, and her PCP also suspected that patient may have " pericarditis".  Meanwhile, during the last few weeks, patient started to feel short of breath associated with exertion, no orthopnea, denies any swelling.  And admitted that she few thirsty all the time and drinks about 1 to 1.5 gallons of fluid every day.  She was told by her PCP to put some electrolyte powder into the water, and she had hyponatremia related seizure 3 years ago in 2018. ED Course:  Sodium 125, x-ray shows possible left lower field infiltrates   Hospital Course by Problem:   Chest pain/exertional dyspnea with pericardial effusion: pericarditis -troponin flat -Echo: EF of 60 to 65% with no regional wall motion abnormalities.  No evidence of tamponade but there is a small to moderate pericardial effusion in the posterior and lateral to the left ventricle and anterior to the right ventricle -cardiology consult appreciated -Plan to continue colchicine for 3 months -ibuprofen help due to heme + stools-- patient resistant to re-starting even though Hgb stable  -Patient will also need echo in 2 weeks to ensure stabilization  Anemia -Appears to be of chronic inflammation -heme positive -outpatient GI follow up  Small pleural effusions -seen by pulm -should resolve on own  Possible autoimmune disorder -Patient reported last summer and autumn she had to prolonged episodesof migrating joint swelling and pain in her fingers wrist, elbow and knees and low grade fever but no rash.  -She went to Dr. Addison Lank who did some work-up: see below -Her PCP recommended her to go see a rheumatologist--has not been able to get into rheumatology but she has reached out to her specialist at [facility] who will attempt to work her in -I have reviewed her labs from outside facility including: Labs from 8/20 and 11/20 with a negative rheumatoid factor.  Patient had 3 ANAs drawn all 3 were positive with the patterns of 1: 160--> 1: 1280--> 1: 640 -will order further work-up including the following labs: Antiextractable nuclear antigens: Including Smith, RNP, SSA/SSB Will reach out  to [facility] rheumatology to see if patient can get quicker follow-up with them -as weill need to hold ibuprofen will do trial of steroids at 20 mg daily for a few days to then drop to 15 mg and taper from there  Acute onChronic hyponatremia, -Serum osmolality low, urine osmolality normal, sodium urine: < 10 -Patient does  states she drinks a lot of water so could be from water Poto mania -Continue to monitor -did well on salt tabs so will resume with close outpatient follow up-- patient says this has been a lifelong problem  MGUS -Patient F/Uoutpatient with oncologist at [facility].  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 female with IgG kappa plasma cell dyscrasia and xrays suspicious for lytic lesions but a PET/CT without any evidence of FDG-avid osseous disease, who presents in follow up. Disease is most consistent with MGUS/early smoldering MM.   When she presented to our clinic, her M-spike had improved, she had a low burden of disease in her marrow, no dysregulation of her free light chains, and minimal proteinuria. She also has no anemia, hypercalcemia, or renal dysfunction. The results of the PET/CT showed there was no evidence of FDG-avid osseous disease, surprisingly, there isn't even uptake around the T4 fracture, making me question the chronicity of this lesion. Her case was discussed with Drs. Hopkins and Haralson who presented her case at the Spine Metastasis Tumor Board. Following this, their recommendation was prospective monitoring. 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  In February, she had a small progression in M-spike and new dysregulation of light chains which is consistent with MGUS disease progression. This may have been as a result of a change in her lifestyle. Following resuming a strict keto diet, her labs improved. This past Summer she suffered some type of viral insult creating significant inflammatory stressors in her body, her platelet elevation is likely still a result of reactivity. I am pleased to see her Mspike is improved but FLC slightly disregulated, possibly as a result. Will CTM and encourage healthy lifestyle.   CAP- disproven -Was started on amoxicillin as an outpatient -Changed over to  ceftriaxone/azithromycin -Procalcitonin less than 0.1, doubt infectious process so will DC antibiotics  Abdominal distention -Bowel regimen -Suspect due to constipation -bowel regimen while on pain meds  Decreased WBC count -doubt from colchicine but will need monitoring    Medical Consultants:    Cardiology Pulmonology Rheumatology (phone)  Discharge Exam:   Vitals:   05/11/19 0608 05/11/19 1314  BP: 129/81 124/84  Pulse: 73 91  Resp: 16 17  Temp: 97.9 F (36.6 C) 98.2 F (36.8 C)  SpO2: 97% 96%   Vitals:   05/10/19 2138 05/11/19 0320 05/11/19 0608 05/11/19 1314  BP: 125/77 (!) 141/86 129/81 124/84  Pulse: 75 75 73 91  Resp: 18  16 17   Temp: 97.8 F (36.6 C)  97.9 F (36.6 C) 98.2 F (36.8 C)  TempSrc: Oral  Oral Oral  SpO2: 93% 98% 97% 96%  Weight:      Height:        General exam: Appears calm and comfortable.   The results of significant diagnostics from this hospitalization (including imaging, microbiology, ancillary and laboratory) are listed below for reference.     Procedures and Diagnostic Studies:   DG Chest 2 View  Result Date: 05/06/2019 CLINICAL DATA:  Chest pain.  Fever. EXAM: CHEST - 2 VIEW COMPARISON:  April 30, 2019  FINDINGS: The cardiac silhouette remains enlarged, slightly more prominent the interval. Bilateral pleural effusions remains small but are larger in the interval. There is opacity in the left retrocardiac region. The hila and mediastinum are normal. No pneumothorax. No nodules or masses. IMPRESSION: 1. The cardiac silhouette appears more prominent the interval. The difference may be technical in nature. 2. Small but increasing effusions. 3. Left retrocardiac opacity could represent atelectasis, pneumonia, or aspiration. Recommend clinical correlation. Recommend short-term follow-up imaging to ensure resolution. Electronically Signed   By: Dorise Bullion III M.D   On: 05/06/2019 12:52   ECHOCARDIOGRAM COMPLETE  Result Date:  05/06/2019    ECHOCARDIOGRAM REPORT   Patient Name:   Victoria Stokes Date of Exam: 05/06/2019 Medical Rec #:  [id number]   Height:       67.0 in Accession #:    [id number]  Weight:       138.9 lb Date of Birth:  08-Feb-1956    BSA:          1.732 m Patient Age:    37 years    BP:           114/76 mmHg Patient Gender: F           HR:           91 bpm. Exam Location:  Inpatient Procedure: 2D Echo, Cardiac Doppler and Color Doppler Indications:    R07.9* Chest pain, unspecified; I31.3 Pericardial effusion  History:        Patient has no prior history of Echocardiogram examinations.                 Seizures.  Sonographer:    Jonelle Sidle Dance Referring Phys: [id number] [place]  1. Left ventricular ejection fraction, by estimation, is 60 to 65%. The left ventricle has normal function. The left ventricle has no regional wall motion abnormalities. Left ventricular diastolic parameters were normal.  2. Right ventricular systolic function is normal. The right ventricular size is normal. There is normal pulmonary artery systolic pressure.  3. No evidence of tamponade . small to moderate. The pericardial effusion is posterior and lateral to the left ventricle and anterior to the right ventricle.  4. The mitral valve is normal in structure. Trivial mitral valve regurgitation. No evidence of mitral stenosis.  5. The aortic valve is tricuspid. Aortic valve regurgitation is trivial. Mild to moderate aortic valve sclerosis/calcification is present, without any evidence of aortic stenosis.  6. The inferior vena cava is dilated in size with >50% respiratory variability, suggesting right atrial pressure of 8 mmHg. FINDINGS  Left Ventricle: Left ventricular ejection fraction, by estimation, is 60 to 65%. The left ventricle has normal function. The left ventricle has no regional wall motion abnormalities. The left ventricular internal cavity size was normal in size. There is  no left ventricular hypertrophy. Left ventricular  diastolic parameters were normal. Right Ventricle: The right ventricular size is normal. No increase in right ventricular wall thickness. Right ventricular systolic function is normal. There is normal pulmonary artery systolic pressure. The tricuspid regurgitant velocity is 1.77 m/s, and  with an assumed right atrial pressure of 8 mmHg, the estimated right ventricular systolic pressure is 123XX123 mmHg. Left Atrium: Left atrial size was normal in size. Right Atrium: Right atrial size was normal in size. Pericardium: No evidence of tamponade. Small to moderate. The pericardial effusion is posterior and lateral to the left ventricle and anterior to the right ventricle. Mitral Valve: The mitral valve  is normal in structure. There is mild thickening of the mitral valve leaflet(s). Normal mobility of the mitral valve leaflets. Trivial mitral valve regurgitation. No evidence of mitral valve stenosis. Tricuspid Valve: The tricuspid valve is normal in structure. Tricuspid valve regurgitation is mild . No evidence of tricuspid stenosis. Aortic Valve: The aortic valve is tricuspid. Aortic valve regurgitation is trivial. Aortic regurgitation PHT measures 486 msec. Mild to moderate aortic valve sclerosis/calcification is present, without any evidence of aortic stenosis. Pulmonic Valve: The pulmonic valve was normal in structure. Pulmonic valve regurgitation is not visualized. No evidence of pulmonic stenosis. Aorta: The aortic root is normal in size and structure. Venous: The inferior vena cava is dilated in size with greater than 50% respiratory variability, suggesting right atrial pressure of 8 mmHg. IAS/Shunts: No atrial level shunt detected by color flow Doppler.  LEFT VENTRICLE PLAX 2D LVIDd:         4.53 cm  Diastology LVIDs:         2.76 cm  LV e' lateral:   8.49 cm/s LV PW:         1.09 cm  LV E/e' lateral: 8.9 LV IVS:        0.83 cm  LV e' medial:    8.59 cm/s LVOT diam:     2.00 cm  LV E/e' medial:  8.8 LV SV:         67  LV SV Index:   39 LVOT Area:     3.14 cm  RIGHT VENTRICLE             IVC RV Basal diam:  2.56 cm     IVC diam: 2.55 cm RV S prime:     10.40 cm/s TAPSE (M-mode): 1.8 cm LEFT ATRIUM             Index       RIGHT ATRIUM           Index LA diam:        3.40 cm 1.96 cm/m  RA Area:     14.20 cm LA Vol (A2C):   44.6 ml 25.75 ml/m RA Volume:   34.40 ml  19.86 ml/m LA Vol (A4C):   30.8 ml 17.78 ml/m LA Biplane Vol: 38.2 ml 22.06 ml/m  AORTIC VALVE LVOT Vmax:   111.00 cm/s LVOT Vmean:  74.200 cm/s LVOT VTI:    0.213 m AI PHT:      486 msec  AORTA Ao Root diam: 3.40 cm Ao Asc diam:  3.40 cm MITRAL VALVE               TRICUSPID VALVE MV Area (PHT): 3.08 cm    TR Peak grad:   12.5 mmHg MV Decel Time: 246 msec    TR Vmax:        177.00 cm/s MV E velocity: 75.70 cm/s MV A velocity: 65.00 cm/s  SHUNTS MV E/A ratio:  1.16        Systemic VTI:  0.21 m                            Systemic Diam: 2.00 cm Jenkins Rouge MD Electronically signed by Jenkins Rouge MD Signature Date/Time: 05/06/2019/4:31:31 PM    Final      Labs:   Basic Metabolic Panel: Recent Labs  Lab 05/07/19 [id number] 05/07/19 [id number] 05/08/19 [id number] 05/08/19 [id number] 05/09/19 0745 05/09/19 0745 05/10/19 0242 05/11/19 0518  NA 128*  --  131*  --  130*  --  127* 133*  K 3.9   < > 4.1   < > 3.9   < > 4.2 3.6  CL 94*  --  97*  --  96*  --  94* 97*  CO2 25  --  25  --  23  --  24 25  GLUCOSE 92  --  90  --  91  --  94 87  BUN 7*  --  8  --  5*  --  9 8  CREATININE 0.67  --  0.59  --  0.60  --  0.61 0.64  CALCIUM 8.2*  --  8.2*  --  8.4*  --  8.5* 8.7*   < > = values in this interval not displayed.   GFR Estimated Creatinine Clearance: 70.9 mL/min (by C-G formula based on SCr of 0.64 mg/dL). Liver Function Tests: No results for input(s): AST, ALT, ALKPHOS, BILITOT, PROT, ALBUMIN in the last 168 hours. No results for input(s): LIPASE, AMYLASE in the last 168 hours. No results for input(s): AMMONIA in the last 168 hours. Coagulation profile No results  for input(s): INR, PROTIME in the last 168 hours.  CBC: Recent Labs  Lab 05/06/19 1211 05/08/19 0513 05/09/19 0745 05/10/19 0242 05/11/19 0518  WBC 6.6 2.9* 4.1 3.0* 3.9*  NEUTROABS  --  2.3  --   --   --   HGB 10.3* 9.2* 10.7* 10.0* 10.5*  HCT 31.0* 27.8* 32.0* 29.9* 31.4*  MCV 88.6 89.1 87.0 87.2 88.0  PLT 387 354 459* 428* 485*   Cardiac Enzymes: No results for input(s): CKTOTAL, CKMB, CKMBINDEX, TROPONINI in the last 168 hours. BNP: Invalid input(s): POCBNP CBG: No results for input(s): GLUCAP in the last 168 hours. D-Dimer No results for input(s): DDIMER in the last 72 hours. Hgb A1c No results for input(s): HGBA1C in the last 72 hours. Lipid Profile No results for input(s): CHOL, HDL, LDLCALC, TRIG, CHOLHDL, LDLDIRECT in the last 72 hours. Thyroid function studies No results for input(s): TSH, T4TOTAL, T3FREE, THYROIDAB in the last 72 hours.  Invalid input(s): FREET3 Anemia work up Recent Labs    05/09/19 0745  FERRITIN 287  TIBC 195*  IRON 22*   Microbiology Recent Results (from the past 240 hour(s))  SARS CORONAVIRUS 2 (TAT 6-24 HRS)     Status: None   Collection Time: 05/06/19  1:39 PM  Result Value Ref Range Status   SARS Coronavirus 2 NEGATIVE NEGATIVE Final    Comment: (NOTE) SARS-CoV-2 target nucleic acids are NOT DETECTED. The SARS-CoV-2 RNA is generally detectable in upper and lower respiratory specimens during the acute phase of infection. Negative results do not preclude SARS-CoV-2 infection, do not rule out co-infections with other pathogens, and should not be used as the sole basis for treatment or other patient management decisions. Negative results must be combined with clinical observations, patient history, and epidemiological information. The expected result is Negative. Fact Sheet for Patients: SugarRoll.be Fact Sheet for Healthcare Providers: https://www.woods-mathews.com/ This test is not  yet approved or cleared by the [place] FDA and  has been authorized for detection and/or diagnosis of SARS-CoV-2 by FDA under an Emergency Use Authorization (EUA). This EUA will remain  in effect (meaning this test can be used) for the duration of the COVID-19 declaration under Section 56 4(b)(1) of the Act, 21 U.S.C. section 360bbb-3(b)(1), unless the authorization is terminated or revoked sooner. Performed at [facility], [address]  [place], [place] [address]   Blood culture (routine x 2)     Status: None   Collection Time: 05/06/19  2:14 PM   Specimen: BLOOD  Result Value Ref Range Status   Specimen Description BLOOD RIGHT ANTECUBITAL  Final   Special Requests   Final    BOTTLES DRAWN AEROBIC ONLY Blood Culture results may not be optimal due to an inadequate volume of blood received in culture bottles Performed at [facility] [address] [place], [place] [address]    Culture NO GROWTH 5 DAYS  Final   Report Status 05/11/2019 FINAL  Final  Blood culture (routine x 2)     Status: None   Collection Time: 05/06/19  2:17 PM   Specimen: BLOOD  Result Value Ref Range Status   Specimen Description BLOOD LEFT ANTECUBITAL  Final   Special Requests   Final    BOTTLES DRAWN AEROBIC AND ANAEROBIC Blood Culture results may not be optimal due to an inadequate volume of blood received in culture bottles Performed at [facility], [place] [address] [place], [place] [address]    Culture NO GROWTH 5 DAYS  Final   Report Status 05/11/2019 FINAL  Final     Discharge Instructions:   Discharge Instructions    Diet general   Complete by: As directed    Discharge instructions   Complete by: As directed    if any worsening symptoms, increasing shortness of breath, increasing chest discomfort-a chest x-ray may be obtained to assess for evolution of effusion Incentive spirometry   Increase activity slowly   Complete by: As directed      Allergies as of  05/11/2019      Reactions   Gluten Meal Other (See Comments)   headache      Medication List    STOP taking these medications   acetaminophen 500 MG tablet Commonly known as: TYLENOL   amoxicillin-clavulanate 875-125 MG tablet Commonly known as: AUGMENTIN   LORazepam 0.5 MG tablet Commonly known as: ATIVAN     TAKE these medications   ALPRAZolam 0.5 MG tablet Commonly known as: XANAX Take 2 tablets approximately 45 minutes prior to the MRI study, take a third tablet if needed.   CHOLINE PO Take 1 tablet by mouth daily.   colchicine 0.6 MG tablet Take 0.5 tablets (0.3 mg total) by mouth 2 (two) times daily.   HYDROcodone-acetaminophen 5-325 MG tablet Commonly known as: NORCO/VICODIN Take 1 tablet by mouth every 6 (six) hours as needed for up to 7 days for severe pain.   Melatonin 10 MG Tabs Take 10 mg by mouth at bedtime.   pantoprazole 40 MG tablet Commonly known as: PROTONIX Take 1 tablet (40 mg total) by mouth daily. Start taking on: May 12, 2019   predniSONE 5 MG tablet Commonly known as: DELTASONE 20 mg x 5 days then 15 mg x 5 days then 10 mg x 5 days then follow up with rheumatology for further instructions   sodium chloride 1 g tablet Take 1 tablet (1 g total) by mouth daily. Start taking on: May 12, 2019   valACYclovir 500 MG tablet Commonly known as: VALTREX Take 500 mg by mouth 2 (two) times daily as needed. Cold sores   VITAMIN A PO Take 1 tablet by mouth daily.   VITAMIN B-COMPLEX PO Take 1 tablet by mouth daily.   VITAMIN D PO Take 5,000 Units by mouth daily.   VITAMIN K PO Take 1 tablet by  mouth daily.   zolpidem 5 MG tablet Commonly known as: AMBIEN Take 5 mg by mouth as needed for sleep.      Follow-up Information    Elouise Munroe, MD Follow up.   Specialties: Cardiology, Radiology Why: for repeat echo Contact information: [place] [address] [phone]        Cari Caraway, MD  Follow up in 1 week(s).   Specialty: Family Medicine Contact information: [place] [id number] [phone]            Time coordinating discharge: 35 min  Signed:  Geradine Girt DO  [facility] 05/11/2019, 4:46 PM

## 2019-05-13 DIAGNOSIS — E871 Hypo-osmolality and hyponatremia: Secondary | ICD-10-CM | POA: Diagnosis not present

## 2019-05-13 DIAGNOSIS — D6489 Other specified anemias: Secondary | ICD-10-CM | POA: Diagnosis not present

## 2019-05-14 DIAGNOSIS — M1990 Unspecified osteoarthritis, unspecified site: Secondary | ICD-10-CM | POA: Diagnosis not present

## 2019-05-21 DIAGNOSIS — C9000 Multiple myeloma not having achieved remission: Secondary | ICD-10-CM | POA: Diagnosis not present

## 2019-05-25 ENCOUNTER — Telehealth: Payer: Self-pay | Admitting: Internal Medicine

## 2019-05-25 DIAGNOSIS — I3139 Other pericardial effusion (noninflammatory): Secondary | ICD-10-CM

## 2019-05-25 DIAGNOSIS — I313 Pericardial effusion (noninflammatory): Secondary | ICD-10-CM

## 2019-05-25 NOTE — Telephone Encounter (Signed)
Victoria Stokes is calling requesting an Echo be performed before her appointment scheduled for 06/03/19, but there was no active request in the system in order for me to schedule. Please advise.

## 2019-05-25 NOTE — Telephone Encounter (Signed)
Yes please schedule a limited echo for before she sees me to reassess pericardial effusion. I had requested it from when she was in the hospital. Thanks, [place]

## 2019-05-25 NOTE — Addendum Note (Signed)
Addended by: Caprice Beaver T on: 05/25/2019 11:43 AM   Modules accepted: Orders

## 2019-05-25 NOTE — Telephone Encounter (Signed)
Order is in the system for the limited ECHO. Will route to scheduling to get scheduled before appointment 4/21. Thanks!

## 2019-05-26 ENCOUNTER — Ambulatory Visit (HOSPITAL_COMMUNITY): Payer: 59 | Attending: Internal Medicine

## 2019-05-26 ENCOUNTER — Other Ambulatory Visit: Payer: Self-pay

## 2019-05-26 DIAGNOSIS — I313 Pericardial effusion (noninflammatory): Secondary | ICD-10-CM | POA: Diagnosis not present

## 2019-05-26 DIAGNOSIS — I3139 Other pericardial effusion (noninflammatory): Secondary | ICD-10-CM

## 2019-05-27 ENCOUNTER — Other Ambulatory Visit: Payer: Self-pay | Admitting: Family Medicine

## 2019-05-27 DIAGNOSIS — R1011 Right upper quadrant pain: Secondary | ICD-10-CM

## 2019-05-28 ENCOUNTER — Ambulatory Visit
Admission: RE | Admit: 2019-05-28 | Discharge: 2019-05-28 | Disposition: A | Discharge status: Home / Self Care | Payer: 59 | Source: Ambulatory Visit | Attending: Family Medicine | Admitting: Family Medicine

## 2019-05-28 ENCOUNTER — Other Ambulatory Visit: Payer: Self-pay

## 2019-05-28 DIAGNOSIS — D8989 Other specified disorders involving the immune mechanism, not elsewhere classified: Secondary | ICD-10-CM | POA: Diagnosis not present

## 2019-05-28 DIAGNOSIS — R1011 Right upper quadrant pain: Secondary | ICD-10-CM

## 2019-06-03 ENCOUNTER — Ambulatory Visit (INDEPENDENT_AMBULATORY_CARE_PROVIDER_SITE_OTHER): Payer: 59 | Admitting: Internal Medicine

## 2019-06-03 ENCOUNTER — Encounter: Payer: Self-pay | Admitting: Internal Medicine

## 2019-06-03 ENCOUNTER — Other Ambulatory Visit: Payer: Self-pay

## 2019-06-03 ENCOUNTER — Telehealth: Payer: Self-pay

## 2019-06-03 VITALS — BP 116/70 | HR 111 | Temp 97.2°F | Ht 67.0 in | Wt 124.0 lb

## 2019-06-03 DIAGNOSIS — I471 Supraventricular tachycardia: Secondary | ICD-10-CM

## 2019-06-03 DIAGNOSIS — M359 Systemic involvement of connective tissue, unspecified: Secondary | ICD-10-CM | POA: Diagnosis not present

## 2019-06-03 DIAGNOSIS — I3139 Other pericardial effusion (noninflammatory): Secondary | ICD-10-CM

## 2019-06-03 DIAGNOSIS — M329 Systemic lupus erythematosus, unspecified: Secondary | ICD-10-CM | POA: Diagnosis not present

## 2019-06-03 DIAGNOSIS — I313 Pericardial effusion (noninflammatory): Secondary | ICD-10-CM

## 2019-06-03 NOTE — Patient Instructions (Addendum)
Medication Instructions:  CONTINUE WITH CURRENT MEDICATIONS. NO CHANGES.  *If you need a refill on your cardiac medications before your next appointment, please call your pharmacy*   Follow-Up: At [facility], you and your health needs are our priority.  As part of our continuing mission to provide you with exceptional heart care, we have created designated Provider Care Teams.  These Care Teams include your primary Cardiologist (physician) and Advanced Practice Providers (APPs -  Physician Assistants and Nurse Practitioners) who all work together to provide you with the care you need, when you need it.  We recommend signing up for the patient portal called "MyChart".  Sign up information is provided on this After Visit Summary.  MyChart is used to connect with patients for Virtual Visits (Telemedicine).  Patients are able to view lab/test results, encounter notes, upcoming appointments, etc.  Non-urgent messages can be sent to your provider as well.   To learn more about what you can do with MyChart, go to [email or web address].    Your next appointment:   Victoria Stokes 24th at 9:40am  Other instructions: If you experience heart racing or palpitations please call our office so we can discuss a heart monitor!   Mediterranean Diet A Mediterranean diet refers to food and lifestyle choices that are based on the traditions of countries located on the [employer]. This way of eating has been shown to help prevent certain conditions and improve outcomes for people who have chronic diseases, like kidney disease and heart disease. What are tips for following this plan? Lifestyle  Cook and eat meals together with your family, when possible.  Drink enough fluid to keep your urine clear or pale yellow.  Be physically active every day. This includes: ? Aerobic exercise like running or swimming. ? Leisure activities like gardening, walking, or housework.  Get 7-8 hours of sleep each night.  If  recommended by your health care provider, drink red wine in moderation. This means 1 glass a day for nonpregnant women and 2 glasses a day for men. A glass of wine equals 5 oz (150 mL). Reading food labels   Check the serving size of packaged foods. For foods such as rice and pasta, the serving size refers to the amount of cooked product, not dry.  Check the total fat in packaged foods. Avoid foods that have saturated fat or trans fats.  Check the ingredients list for added sugars, such as corn syrup. Shopping  At the grocery store, buy most of your food from the areas near the walls of the store. This includes: ? Fresh fruits and vegetables (produce). ? Grains, beans, nuts, and seeds. Some of these may be available in unpackaged forms or large amounts (in bulk). ? Fresh seafood. ? Poultry and eggs. ? Low-fat dairy products.  Buy whole ingredients instead of prepackaged foods.  Buy fresh fruits and vegetables in-season from local farmers markets.  Buy frozen fruits and vegetables in resealable bags.  If you do not have access to quality fresh seafood, buy precooked frozen shrimp or canned fish, such as tuna, salmon, or sardines.  Buy small amounts of raw or cooked vegetables, salads, or olives from the deli or salad bar at your store.  Stock your pantry so you always have certain foods on hand, such as olive oil, canned tuna, canned tomatoes, rice, pasta, and beans. Cooking  Cook foods with extra-virgin olive oil instead of using butter or other vegetable oils.  Have meat as a side dish, and have  vegetables or grains as your main dish. This means having meat in small portions or adding small amounts of meat to foods like pasta or stew.  Use beans or vegetables instead of meat in common dishes like chili or lasagna.  Experiment with different cooking methods. Try roasting or broiling vegetables instead of steaming or sauteing them.  Add frozen vegetables to soups, stews, pasta,  or rice.  Add nuts or seeds for added healthy fat at each meal. You can add these to yogurt, salads, or vegetable dishes.  Marinate fish or vegetables using olive oil, lemon juice, garlic, and fresh herbs. Meal planning   Plan to eat 1 vegetarian meal one day each week. Try to work up to 2 vegetarian meals, if possible.  Eat seafood 2 or more times a week.  Have healthy snacks readily available, such as: ? Vegetable sticks with hummus. ? [place] yogurt. ? Fruit and nut trail mix.  Eat balanced meals throughout the week. This includes: ? Fruit: 2-3 servings a day ? Vegetables: 4-5 servings a day ? Low-fat dairy: 2 servings a day ? Fish, poultry, or lean meat: 1 serving a day ? Beans and legumes: 2 or more servings a week ? Nuts and seeds: 1-2 servings a day ? Whole grains: 6-8 servings a day ? Extra-virgin olive oil: 3-4 servings a day  Limit red meat and sweets to only a few servings a month What are my food choices?  Mediterranean diet ? Recommended  Grains: Whole-grain pasta. Brown rice. Bulgar wheat. Polenta. Couscous. Whole-wheat bread. [place].  Vegetables: Artichokes. Beets. Broccoli. Cabbage. Carrots. Eggplant. Green beans. Chard. Kale. Spinach. Onions. Leeks. Peas. Squash. Tomatoes. Peppers. Radishes.  Fruits: Apples. Apricots. Avocado. Berries. Bananas. Cherries. Dates. Figs. Grapes. Lemons. Melon. Oranges. Peaches. Plums. Pomegranate.  Meats and other protein foods: Beans. Almonds. Sunflower seeds. Pine nuts. Peanuts. [place]. Salmon. Scallops. Shrimp. [place]. Tilapia. Clams. Oysters. Eggs.  Dairy: Low-fat milk. Cheese. Greek yogurt.  Beverages: Water. Red wine. Herbal tea.  Fats and oils: Extra virgin olive oil. Avocado oil. Grape seed oil.  Sweets and desserts: [place] yogurt with honey. Baked apples. Poached pears. Trail mix.  Seasoning and other foods: Basil. Cilantro. Coriander. Cumin. Mint. Parsley. Sage. Rosemary. Tarragon. Garlic. Oregano. Thyme.  Pepper. Balsalmic vinegar. Tahini. Hummus. Tomato sauce. Olives. Mushrooms. ? Limit these  Grains: Prepackaged pasta or rice dishes. Prepackaged cereal with added sugar.  Vegetables: Deep fried potatoes (french fries).  Fruits: Fruit canned in syrup.  Meats and other protein foods: Beef. Pork. Lamb. Poultry with skin. Hot dogs. Berniece Salines.  Dairy: Ice cream. Sour cream. Whole milk.  Beverages: Juice. Sugar-sweetened soft drinks. Beer. Liquor and spirits.  Fats and oils: Butter. Canola oil. Vegetable oil. Beef fat (tallow). Lard.  Sweets and desserts: Cookies. Cakes. Pies. Candy.  Seasoning and other foods: Mayonnaise. Premade sauces and marinades. The items listed may not be a complete list. Talk with your dietitian about what dietary choices are right for you. Summary  The Mediterranean diet includes both food and lifestyle choices.  Eat a variety of fresh fruits and vegetables, beans, nuts, seeds, and whole grains.  Limit the amount of red meat and sweets that you eat.  Talk with your health care provider about whether it is safe for you to drink red wine in moderation. This means 1 glass a day for nonpregnant women and 2 glasses a day for men. A glass of wine equals 5 oz (150 mL). This information is not intended to replace advice given  to you by your health care provider. Make sure you discuss any questions you have with your health care provider. Document Revised: 09/29/2015 Document Reviewed: 09/22/2015 Elsevier Patient Education  [place].

## 2019-06-03 NOTE — Progress Notes (Signed)
Cardiology Office Note:    Date:  06/03/2019   ID:  Victoria Stokes, DOB 1955-03-02, MRN [id number]  PCP:  Victoria Lass, MD  Cardiologist:  Victoria Munroe, MD  Electrophysiologist:  None   Referring MD: Victoria Lass, MD   Chief Complaint: follow up pericarditis with effusion.  History of Present Illness:    Victoria Stokes is a 64 y.o. 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 Shewent to her PCP, prescribed amoxicillin, for possible pneumonia, and herPCP also suspected that patient may have"pericarditis".Meanwhile, during the last few weeks, patient started to feel short of breath associated with exertion, no orthopnea,denied any swelling.  Evaluation in hospital revealed a small-moderate pericardial effusion without tamponade clinically or by echo. Inflammatory markers elevated, suspicion for rheumatologic etiology with fevers. Visited a rheumatologist at [facility] who felt testing was inconclusive per patient. Local rheumatologist feels diagnosis consistent with Lupus/CTD.   Continues to have abdominal pain. Abdominal ultrasound unremarkable for source.  MGUS - stable per patient, currently followed at [facility]  We reviewed echo results, with near resolution of the pericardial effusion. She feels better and has not had interval chest pain or significant SOB.   Past Medical History:  Diagnosis Date  . Low sodium levels    Hx  . Seizures ([place]) 10/29/2016    Past Surgical History:  Procedure Laterality Date  . CESAREAN SECTION      Current Medications: Current Meds  Medication Sig  .  B Complex Vitamins (VITAMIN B-COMPLEX PO) Take 1 tablet by mouth daily.   . Cholecalciferol (VITAMIN D PO) Take 5,000 Units by mouth daily.   . CHOLINE PO Take 1 tablet by mouth daily.   . colchicine 0.6 MG tablet Take 0.5 tablets (0.3 mg total) by mouth 2 (two) times daily.  . Melatonin 10 MG TABS Take 10 mg by mouth at bedtime.   . sodium chloride 1 g tablet Take 1 tablet (1 g total) by mouth daily.  . valACYclovir (VALTREX) 500 MG tablet Take 500 mg by mouth 2 (two) times daily as needed. Cold sores  . VITAMIN A PO Take 1 tablet by mouth daily.   Marland Kitchen VITAMIN K PO Take 1 tablet by mouth daily.   Marland Kitchen zolpidem (AMBIEN) 5 MG tablet Take 5 mg by mouth as needed for sleep.  . [DISCONTINUED] predniSONE (DELTASONE) 5 MG tablet 20 mg x 5 days then 15 mg x 5 days then 10 mg x 5 days then follow up with rheumatology for further instructions     Allergies:   Gluten meal   Social History   Socioeconomic History  . Marital status: Married    Spouse name: Collier Salina  . Number of children: 2  . Years of education: 56  . Highest education level: Not on file  Occupational History  . Occupation: Chief Executive Officer  Tobacco Use  . Smoking status: Never Smoker  . Smokeless tobacco: Never Used  Substance and Sexual Activity  . Alcohol use: Yes    Alcohol/week: 0.0 standard drinks    Comment: 2 drinks per month  . Drug use: No  . Sexual activity: Not on file  Other Topics Concern  .  Not on file  Social History Narrative   Lives   Caffeine use: 2 cups per day (coffee)   Right handed    Social Determinants of Health   Financial Resource Strain:   . Difficulty of Paying Living Expenses:   Food Insecurity:   . Worried About Charity fundraiser in the Last Year:   . Arboriculturist in the Last Year:   Transportation Needs:   . Film/video editor (Medical):   Marland Kitchen Lack of Transportation (Non-Medical):   Physical Activity:   . Days of Exercise per Week:   . Minutes of Exercise per Session:   Stress:   .  Feeling of Stress :   Social Connections:   . Frequency of Communication with Friends and Family:   . Frequency of Social Gatherings with Friends and Family:   . Attends Religious Services:   . Active Member of Clubs or Organizations:   . Attends Archivist Meetings:   Marland Kitchen Marital Status:      Family History: The patient's family history includes Breast cancer in her mother; Pancreatic cancer in her father. There is no history of Seizures.  ROS:   Please see the history of present illness.    All other systems reviewed and are negative.  EKGs/Labs/Other Studies Reviewed:    The following studies were reviewed today:  EKG:  Atrial tachycardia with 2:1 conduction, LAE, low voltage, nonspecific T wave abnormality  Recent Labs: 04/30/2019: ALT 18; TSH 1.312 05/11/2019: BUN 8; Creatinine, Ser 0.64; Hemoglobin 10.5; Platelets 485; Potassium 3.6; Sodium 133  Recent Lipid Panel No results found for: CHOL, TRIG, HDL, CHOLHDL, VLDL, LDLCALC, LDLDIRECT  Physical Exam:    VS:  BP 116/70 (BP Location: Right Arm, Patient Position: Sitting, Cuff Size: Normal)   Pulse (!) 111   Temp (!) 97.2 F (36.2 C)   Ht 5\' 7"  (1.702 m)   Wt 124 lb (56.2 kg)   BMI 19.42 kg/m     Wt Readings from Last 5 Encounters:  06/03/19 124 lb (56.2 kg)  05/06/19 138 lb 14.2 oz (63 kg)  04/30/19 140 lb (63.5 kg)  01/10/17 145 lb 1.6 oz (65.8 kg)  01/02/17 144 lb 6.4 oz (65.5 kg)     Constitutional: No acute distress Eyes: sclera non-icteric, normal conjunctiva and lids ENMT: normal dentition, moist mucous membranes Cardiovascular: regular rhythm, normal rate, no murmurs. S1 and S2 normal. Radial pulses normal bilaterally. No jugular venous distention.  Respiratory: clear to auscultation bilaterally GI : normal bowel sounds, soft and nontender. No distention.   MSK: extremities warm, well perfused. No edema.  NEURO: grossly nonfocal exam, moves all extremities. PSYCH: alert and oriented x 3,  normal mood and affect.   ASSESSMENT:    1. Pericardial effusion   2. Atrial tachycardia ([place])   3. Connective tissue disorder ([place])   4. Lupus ([place])    PLAN:    Pericardial effusion - resolved. May be related to  CTD/Lupus, investigation continuing with rheum. She had some gi distress with colchicine and the dose was lowered, however this is somewhat costly. We discussed options for changing therapy and she would like to stay on her current dose after our thorough discussion, colchicine 0.3 mg BID. Continue for 3 months to ensure resolution of inflammation. We had thorough discussion about pericarditis and treatment to avoid progression to chronic pericarditis including natural history and incidence of chronic pericarditis reported in literature at approximately 1%.   Atrial tachycardia ([place]) -  ECG today shows atrial tachycardia. I have requested a repeat ECG during our visit however the patient declines. I have also offered the patient a cardiac monitor. She initially declined however determined she would like to pursue monitor. Indication for monitor is to exclude atrial fibrillation and to further assess rhythm.  Plan: CARDIAC EVENT MONITOR  Connective tissue disorder ([place]) Lupus ([place]) She will need an annual echocardiogram for surveillance for pulmonary hypertension in the setting of CTD/Lupus. Discussed with patient including association of pulmonary hypertension as a consequence of CTD.   Total time of encounter: 40 minutes total time of encounter, including 30 minutes spent in face-to-face patient care on the date of this encounter. This time includes coordination of care and counseling regarding above mentioned problem list. Remainder of non-face-to-face time involved reviewing chart documents/testing relevant to the patient encounter and documentation in the medical record. I have independently reviewed documentation from referring provider.   Cherlynn Kaiser, MD [facility]    Medication Adjustments/Labs and Tests Ordered: Current medicines are reviewed at length with the patient today.  Concerns regarding medicines are outlined above.  Orders Placed This Encounter  Procedures  . CARDIAC EVENT MONITOR   No orders of the defined types were placed in this encounter.   Patient Instructions  Medication Instructions:  CONTINUE WITH CURRENT MEDICATIONS. NO CHANGES.  *If you need a refill on your cardiac medications before your next appointment, please call your pharmacy*   Follow-Up: At [facility], you and your health needs are our priority.  As part of our continuing mission to provide you with exceptional heart care, we have created designated Provider Care Teams.  These Care Teams include your primary Cardiologist (physician) and Advanced Practice Providers (APPs -  Physician Assistants and Nurse Practitioners) who all work together to provide you with the care you need, when you need it.  We recommend signing up for the patient portal called "MyChart".  Sign up information is provided on this After Visit Summary.  MyChart is used to connect with patients for Virtual Visits (Telemedicine).  Patients are able to view lab/test results, encounter notes, upcoming appointments, etc.  Non-urgent messages can be sent to your provider as well.   To learn more about what you can do with MyChart, go to [email or web address].    Your next appointment:   June 24th at 9:40am  Other instructions: If you experience heart racing or palpitations please call our office so we can discuss a heart monitor!   Mediterranean Diet A Mediterranean diet refers to food and lifestyle choices that are based on the traditions of countries located on the [employer]. This way of eating has been shown to help prevent certain conditions and improve outcomes for people who have chronic diseases, like kidney disease and heart disease. What are tips for following this  plan? Lifestyle  Cook and eat meals together with your family, when possible.  Drink enough fluid to keep your urine clear or pale yellow.  Be physically active every day. This includes: ? Aerobic exercise like running or swimming. ? Leisure activities like gardening, walking, or housework.  Get 7-8 hours of sleep each night.  If recommended by your health care provider, drink red wine in moderation. This means 1 glass a day for nonpregnant women and 2 glasses a day for men. A glass of wine equals 5 oz (150 mL). Reading food labels   Check the serving size of packaged foods. For foods such as rice  and pasta, the serving size refers to the amount of cooked product, not dry.  Check the total fat in packaged foods. Avoid foods that have saturated fat or trans fats.  Check the ingredients list for added sugars, such as corn syrup. Shopping  At the grocery store, buy most of your food from the areas near the walls of the store. This includes: ? Fresh fruits and vegetables (produce). ? Grains, beans, nuts, and seeds. Some of these may be available in unpackaged forms or large amounts (in bulk). ? Fresh seafood. ? Poultry and eggs. ? Low-fat dairy products.  Buy whole ingredients instead of prepackaged foods.  Buy fresh fruits and vegetables in-season from local farmers markets.  Buy frozen fruits and vegetables in resealable bags.  If you do not have access to quality fresh seafood, buy precooked frozen shrimp or canned fish, such as tuna, salmon, or sardines.  Buy small amounts of raw or cooked vegetables, salads, or olives from the deli or salad bar at your store.  Stock your pantry so you always have certain foods on hand, such as olive oil, canned tuna, canned tomatoes, rice, pasta, and beans. Cooking  Cook foods with extra-virgin olive oil instead of using butter or other vegetable oils.  Have meat as a side dish, and have vegetables or grains as your main dish. This  means having meat in small portions or adding small amounts of meat to foods like pasta or stew.  Use beans or vegetables instead of meat in common dishes like chili or lasagna.  Experiment with different cooking methods. Try roasting or broiling vegetables instead of steaming or sauteing them.  Add frozen vegetables to soups, stews, pasta, or rice.  Add nuts or seeds for added healthy fat at each meal. You can add these to yogurt, salads, or vegetable dishes.  Marinate fish or vegetables using olive oil, lemon juice, garlic, and fresh herbs. Meal planning   Plan to eat 1 vegetarian meal one day each week. Try to work up to 2 vegetarian meals, if possible.  Eat seafood 2 or more times a week.  Have healthy snacks readily available, such as: ? Vegetable sticks with hummus. ? [place] yogurt. ? Fruit and nut trail mix.  Eat balanced meals throughout the week. This includes: ? Fruit: 2-3 servings a day ? Vegetables: 4-5 servings a day ? Low-fat dairy: 2 servings a day ? Fish, poultry, or lean meat: 1 serving a day ? Beans and legumes: 2 or more servings a week ? Nuts and seeds: 1-2 servings a day ? Whole grains: 6-8 servings a day ? Extra-virgin olive oil: 3-4 servings a day  Limit red meat and sweets to only a few servings a month What are my food choices?  Mediterranean diet ? Recommended  Grains: Whole-grain pasta. Brown rice. Bulgar wheat. Polenta. Couscous. Whole-wheat bread. [place].  Vegetables: Artichokes. Beets. Broccoli. Cabbage. Carrots. Eggplant. Green beans. Chard. Kale. Spinach. Onions. Leeks. Peas. Squash. Tomatoes. Peppers. Radishes.  Fruits: Apples. Apricots. Avocado. Berries. Bananas. Cherries. Dates. Figs. Grapes. Lemons. Melon. Oranges. Peaches. Plums. Pomegranate.  Meats and other protein foods: Beans. Almonds. Sunflower seeds. Pine nuts. Peanuts. [place]. Salmon. Scallops. Shrimp. [place]. Tilapia. Clams. Oysters. Eggs.  Dairy: Low-fat milk. Cheese.  Greek yogurt.  Beverages: Water. Red wine. Herbal tea.  Fats and oils: Extra virgin olive oil. Avocado oil. Grape seed oil.  Sweets and desserts: [place] yogurt with honey. Baked apples. Poached pears. Trail mix.  Seasoning and other foods: Basil. Cilantro.  Coriander. Cumin. Mint. Parsley. Sage. Rosemary. Tarragon. Garlic. Oregano. Thyme. Pepper. Balsalmic vinegar. Tahini. Hummus. Tomato sauce. Olives. Mushrooms. ? Limit these  Grains: Prepackaged pasta or rice dishes. Prepackaged cereal with added sugar.  Vegetables: Deep fried potatoes (french fries).  Fruits: Fruit canned in syrup.  Meats and other protein foods: Beef. Pork. Lamb. Poultry with skin. Hot dogs. Berniece Salines.  Dairy: Ice cream. Sour cream. Whole milk.  Beverages: Juice. Sugar-sweetened soft drinks. Beer. Liquor and spirits.  Fats and oils: Butter. Canola oil. Vegetable oil. Beef fat (tallow). Lard.  Sweets and desserts: Cookies. Cakes. Pies. Candy.  Seasoning and other foods: Mayonnaise. Premade sauces and marinades. The items listed may not be a complete list. Talk with your dietitian about what dietary choices are right for you. Summary  The Mediterranean diet includes both food and lifestyle choices.  Eat a variety of fresh fruits and vegetables, beans, nuts, seeds, and whole grains.  Limit the amount of red meat and sweets that you eat.  Talk with your health care provider about whether it is safe for you to drink red wine in moderation. This means 1 glass a day for nonpregnant women and 2 glasses a day for men. A glass of wine equals 5 oz (150 mL). This information is not intended to replace advice given to you by your health care provider. Make sure you discuss any questions you have with your health care provider. Document Revised: 09/29/2015 Document Reviewed: 09/22/2015 Elsevier Patient Education  [place].

## 2019-06-03 NOTE — Telephone Encounter (Signed)
Follow up   Pt is returning call    

## 2019-06-03 NOTE — Telephone Encounter (Signed)
[id number] to discuss ordering a heart monitor per Dr.Acharya. order placed.

## 2019-06-05 NOTE — Telephone Encounter (Signed)
Discussed monitor with pt. Pt verbalized understanding with no other questions at this time.

## 2019-06-10 ENCOUNTER — Ambulatory Visit (INDEPENDENT_AMBULATORY_CARE_PROVIDER_SITE_OTHER): Payer: 59

## 2019-06-10 DIAGNOSIS — R000 Tachycardia, unspecified: Secondary | ICD-10-CM

## 2019-06-10 DIAGNOSIS — I471 Supraventricular tachycardia: Secondary | ICD-10-CM

## 2019-06-10 DIAGNOSIS — I493 Ventricular premature depolarization: Secondary | ICD-10-CM | POA: Diagnosis not present

## 2019-06-12 ENCOUNTER — Telehealth: Payer: Self-pay

## 2019-06-12 NOTE — Telephone Encounter (Signed)
   [facility] Pre-operative Risk Assessment    Request for surgical clearance:  1. What type of surgery is being performed? Colonoscopy / endoscopy   2. When is this surgery scheduled? TBD  3. What type of clearance is required (medical clearance vs. Pharmacy clearance to hold med vs. Both)? medical  4. Are there any medications that need to be held prior to surgery and how long? n/a  5. Practice name and name of physician performing surgery? [facility] / Dr. Paulita Fujita  6. What is your office phone number [phone]   7.   What is your office fax number [phone]  8.   Anesthesia type (None, local, MAC, general) ? propofol   Sherrie Mustache 06/12/2019, 12:40 PM  _________________________________________________________________   (provider comments below)

## 2019-06-12 NOTE — Telephone Encounter (Signed)
The monitor was ordered for an abnormal ECG that likely demonstrates atrial tachycardia. This should not delay any other testing. She can schedule her GI procedure without delay from cardiology.

## 2019-06-15 NOTE — Telephone Encounter (Signed)
   Primary Cardiologist: Elouise Munroe, MD  Chart reviewed as part of pre-operative protocol coverage. Patient was contacted 06/15/2019 in reference to pre-operative risk assessment for pending surgery as outlined below.  Victoria Stokes was last seen on 06/03/19 by Dr. Margaretann Loveless.  Since that day, LAROSE CHAPNICK has done well without new cardiac compliants. Currently wearing cardiac monitor for atrial tachycardia. Per Dr. Margaretann Loveless this does not delay scheduling her procedure.   Therefore, based on ACC/AHA guidelines, the patient would be at acceptable risk for the planned procedure without further cardiovascular testing.   Of note, I did speak with patient who informed me is going to see Dr. Paulita Fujita for an in person visit to determine if testing is needed. I informed we will route recommendations in the event they are still needed.   I will route this recommendation to the requesting party via Epic fax function and remove from pre-op pool.   Please call with questions.  Reino Bellis, NP 06/15/2019, 3:41 PM

## 2019-06-15 NOTE — Telephone Encounter (Signed)
   Primary Cardiologist: Elouise Munroe, MD  Chart reviewed as part of pre-operative protocol coverage. Left message for patient to call back.  Reino Bellis, NP 06/15/2019, 10:14 AM

## 2019-07-03 ENCOUNTER — Other Ambulatory Visit (INDEPENDENT_AMBULATORY_CARE_PROVIDER_SITE_OTHER): Payer: 59

## 2019-07-03 DIAGNOSIS — I10 Essential (primary) hypertension: Secondary | ICD-10-CM | POA: Diagnosis not present

## 2019-07-14 ENCOUNTER — Other Ambulatory Visit: Payer: Self-pay | Admitting: Internal Medicine

## 2019-07-14 DIAGNOSIS — I471 Supraventricular tachycardia: Secondary | ICD-10-CM

## 2019-07-14 DIAGNOSIS — R000 Tachycardia, unspecified: Secondary | ICD-10-CM

## 2019-07-14 DIAGNOSIS — I493 Ventricular premature depolarization: Secondary | ICD-10-CM

## 2019-07-24 ENCOUNTER — Other Ambulatory Visit: Payer: Self-pay

## 2019-07-24 ENCOUNTER — Ambulatory Visit: Payer: 59 | Admitting: Internal Medicine

## 2019-07-24 ENCOUNTER — Encounter: Payer: Self-pay | Admitting: Internal Medicine

## 2019-07-24 VITALS — BP 110/66 | HR 82 | Ht 67.5 in | Wt 128.8 lb

## 2019-07-24 DIAGNOSIS — M359 Systemic involvement of connective tissue, unspecified: Secondary | ICD-10-CM | POA: Diagnosis not present

## 2019-07-24 DIAGNOSIS — I471 Supraventricular tachycardia: Secondary | ICD-10-CM | POA: Diagnosis not present

## 2019-07-24 DIAGNOSIS — M329 Systemic lupus erythematosus, unspecified: Secondary | ICD-10-CM

## 2019-07-24 DIAGNOSIS — I313 Pericardial effusion (noninflammatory): Secondary | ICD-10-CM | POA: Diagnosis not present

## 2019-07-24 DIAGNOSIS — I3139 Other pericardial effusion (noninflammatory): Secondary | ICD-10-CM

## 2019-07-24 NOTE — Progress Notes (Signed)
Cardiology Office Note:    Date:  07/24/2019   ID:  Victoria Stokes, DOB 11/02/1955, MRN [id number]  PCP:  Kathyrn Lass, MD  Cardiologist:  Elouise Munroe, MD  Electrophysiologist:  None   Referring MD: Kathyrn Lass, MD   Chief Complaint: f/u pericarditis  History of Present Illness:    Victoria Stokes is a 64 y.o. female with a history of chronic hyponatremia complicated by seizures, MGUS followed at [facility], who I initially met in the hospital with a presentation of chest pain and increasing shortness of breath with low-grade fever for 2 to 3 weeks.  While in hospital she was noted to have a small to moderate pericardial effusion without tamponade physiology, and repeat echocardiogram in the office in April demonstrates near resolution of pericardial effusion.  She continues to take the colchicine but it gives her nausea.  She has completed 3 months of therapy.  She has no shortness of breath currently.  She continues to describe pleuritic chest pain in a bandlike fashion around the costochondral margin, but the symptoms for which she presented to the hospital have largely resolved.  She has been diagnosed with lupus in the interim, and has been placed on hydroxychloroquine 200 mg daily.  She is anticipating an MRI of the spine and pelvis given history of lytic lesions in relation to her MGUS.  There is some question as to whether this is contributing to her thoracic discomfort.  Past Medical History:  Diagnosis Date  . Low sodium levels    Hx  . Seizures ([place]) 10/29/2016    Past Surgical History:  Procedure Laterality Date  . CESAREAN SECTION      Current Medications: Current Meds  Medication Sig  . B Complex Vitamins (VITAMIN B-COMPLEX PO) Take 1 tablet by mouth daily.   . Cholecalciferol (VITAMIN D PO) Take 5,000 Units by mouth daily.   . hydroxychloroquine (PLAQUENIL) 200 MG tablet Take 200 mg by mouth daily.  . Melatonin 10 MG TABS Take 10 mg by mouth at bedtime.   .  sodium chloride 1 g tablet Take 1 tablet (1 g total) by mouth daily.  . valACYclovir (VALTREX) 500 MG tablet Take 500 mg by mouth 2 (two) times daily as needed. Cold sores  . VITAMIN A PO Take 1 tablet by mouth daily.   Marland Kitchen VITAMIN K PO Take 1 tablet by mouth daily.   Marland Kitchen zolpidem (AMBIEN) 5 MG tablet Take 5 mg by mouth as needed for sleep.  . [DISCONTINUED] CHOLINE PO Take 1 tablet by mouth daily.   . [DISCONTINUED] colchicine 0.6 MG tablet Take 0.5 tablets (0.3 mg total) by mouth 2 (two) times daily.     Allergies:   Gluten meal   Social History   Socioeconomic History  . Marital status: Married    Spouse name: Collier Salina  . Number of children: 2  . Years of education: 75  . Highest education level: Not on file  Occupational History  . Occupation: Chief Executive Officer  Tobacco Use  . Smoking status: Never Smoker  . Smokeless tobacco: Never Used  Vaping Use  . Vaping Use: Never used  Substance and Sexual Activity  . Alcohol use: Yes    Alcohol/week: 0.0 standard drinks    Comment: 2 drinks per month  . Drug use: No  . Sexual activity: Not on file  Other Topics Concern  . Not on file  Social History Narrative   Lives   Caffeine use: 2 cups per day (  coffee)   Right handed    Social Determinants of Health   Financial Resource Strain:   . Difficulty of Paying Living Expenses:   Food Insecurity:   . Worried About Charity fundraiser in the Last Year:   . Arboriculturist in the Last Year:   Transportation Needs:   . Film/video editor (Medical):   Marland Kitchen Lack of Transportation (Non-Medical):   Physical Activity:   . Days of Exercise per Week:   . Minutes of Exercise per Session:   Stress:   . Feeling of Stress :   Social Connections:   . Frequency of Communication with Friends and Family:   . Frequency of Social Gatherings with Friends and Family:   . Attends Religious Services:   . Active Member of Clubs or Organizations:   . Attends Archivist Meetings:   Marland Kitchen Marital Status:       Family History: The patient's family history includes Breast cancer in her mother; Pancreatic cancer in her father. There is no history of Seizures.  ROS:   Please see the history of present illness.    All other systems reviewed and are negative.  EKGs/Labs/Other Studies Reviewed:    The following studies were reviewed today:  EKG:  Not performed today.  Recent Labs: 04/30/2019: ALT 18; TSH 1.312 05/11/2019: BUN 8; Creatinine, Ser 0.64; Hemoglobin 10.5; Platelets 485; Potassium 3.6; Sodium 133  Recent Lipid Panel No results found for: CHOL, TRIG, HDL, CHOLHDL, VLDL, LDLCALC, LDLDIRECT  Physical Exam:    VS:  BP 110/66   Pulse 82   Ht 5' 7.5" (1.715 m)   Wt 128 lb 12.8 oz (58.4 kg)   SpO2 92%   BMI 19.88 kg/m     Wt Readings from Last 5 Encounters:  07/24/19 128 lb 12.8 oz (58.4 kg)  06/03/19 124 lb (56.2 kg)  05/06/19 138 lb 14.2 oz (63 kg)  04/30/19 140 lb (63.5 kg)  01/10/17 145 lb 1.6 oz (65.8 kg)     Constitutional: No acute distress Eyes: sclera non-icteric, normal conjunctiva and lids ENMT: mask in place Cardiovascular: normal HR, no JVP Respiratory: no increased work of breathing GI :  nontender. No distention.   MSK: extremities warm, well perfused. No edema.  NEURO: grossly nonfocal exam, moves all extremities. PSYCH: alert and oriented x 3, normal mood and affect.   ASSESSMENT:    1. Lupus ([place])   2. Connective tissue disorder ([place])   3. Pericardial effusion   4. Atrial tachycardia (HCC)    PLAN:    Lupus ([place])  Connective tissue disorder (HCC)  Pericardial effusion  Atrial tachycardia ([place])   Since her last visit she was diagnosed with lupus and has been started on hydroxychloroquine 200 mg daily.  She has nausea with colchicine and has completed 3 months of therapy.  It is reasonable to stop colchicine at this time and monitor for symptoms.  If she has recurrent symptoms, consider course of prednisone or other immunosuppressive therapy  with regard to underlying diagnosis of lupus.  With a diagnosis of lupus she should have an annual echocardiogram to screen for pulmonary hypertension.  We will complete this next spring.  With her history of MGUS, there is no evidence by echocardiography of cardiac amyloidosis.  She is followed closely by [facility] oncology.  If there are any concerns of amyloidosis, imaging can be performed at our institution or at [facility].  We will perform her next echocardiogram with strain to  evaluate further.  She had only a trace pericardial effusion on her last echocardiogram, and has no worsening of symptoms.  I was suspicious for atrial tachycardia on her last EKG, however extended cardiac monitor shows primarily sinus rhythm and sinus tachycardia.  No diary events noted.  This is reassuring that there is no underlying atrial arrhythmia.  We will see the patient back in 3 months for recheck of symptoms.  We discussed in thorough detail risks modifying cardiovascular behaviors including following the Mediterranean diet and exercise recommendations.   Total time of encounter: 35 minutes total time of encounter, including 25 minutes spent in face-to-face patient care on the date of this encounter. This time includes coordination of care and counseling regarding above mentioned problem list. Remainder of non-face-to-face time involved reviewing chart documents/testing relevant to the patient encounter and documentation in the medical record. I have independently reviewed documentation from referring provider.   Cherlynn Kaiser, MD [place]  [facility]    Medication Adjustments/Labs and Tests Ordered: Current medicines are reviewed at length with the patient today.  Concerns regarding medicines are outlined above.  No orders of the defined types were placed in this encounter.  No orders of the defined types were placed in this encounter.   Patient Instructions  Medication Instructions:  Stop  Colchicine  *If you need a refill on your cardiac medications before your next appointment, please call your pharmacy*   Lab Work: None  If you have labs (blood work) drawn today and your tests are completely normal, you will receive your results only by: Marland Kitchen MyChart Message (if you have MyChart) OR . A paper copy in the mail If you have any lab test that is abnormal or we need to change your treatment, we will call you to review the results.   Testing/Procedures: None   Follow-Up: At [facility], you and your health needs are our priority.  As part of our continuing mission to provide you with exceptional heart care, we have created designated Provider Care Teams.  These Care Teams include your primary Cardiologist (physician) and Advanced Practice Providers (APPs -  Physician Assistants and Nurse Practitioners) who all work together to provide you with the care you need, when you need it.  Your next appointment:   3 month(s)  The format for your next appointment:   In Person  Provider:   Cherlynn Kaiser, MD     [place] refers to food and lifestyle choices that are based on the traditions of countries located on the [place]. This way of eating has been shown to help prevent certain conditions and improve outcomes for people who have chronic diseases, like kidney disease and heart disease. What are tips for following this plan? Lifestyle  Cook and eat meals together with your family, when possible.  Drink enough fluid to keep your urine clear or pale yellow.  Be physically active every day. This includes: ? Aerobic exercise like running or swimming. ? Leisure activities like gardening, walking, or housework.  Get 7-8 hours of sleep each night.  If recommended by your health care provider, drink red wine in moderation. This means 1 glass a day for nonpregnant women and 2 glasses a day for men. A glass of wine equals 5 oz (150  mL). Reading food labels   Check the serving size of packaged foods. For foods such as rice and pasta, the serving size refers to the amount of cooked product, not dry.  Check the total fat in packaged foods. Avoid foods that have saturated fat or trans fats.  Check the ingredients list for added sugars, such as corn syrup. Shopping  At the grocery store, buy most of your food from the areas near the walls of the store. This includes: ? Fresh fruits and vegetables (produce). ? Grains, beans, nuts, and seeds. Some of these may be available in unpackaged forms or large amounts (in bulk). ? Fresh seafood. ? Poultry and eggs. ? Low-fat dairy products.  Buy whole ingredients instead of prepackaged foods.  Buy fresh fruits and vegetables in-season from local farmers markets.  Buy frozen fruits and vegetables in resealable bags.  If you do not have access to quality fresh seafood, buy precooked frozen shrimp or canned fish, such as tuna, salmon, or sardines.  Buy small amounts of raw or cooked vegetables, salads, or olives from the deli or salad bar at your store.  Stock your pantry so you always have certain foods on hand, such as olive oil, canned tuna, canned tomatoes, rice, pasta, and beans. Cooking  Cook foods with extra-virgin olive oil instead of using butter or other vegetable oils.  Have meat as a side dish, and have vegetables or grains as your main dish. This means having meat in small portions or adding small amounts of meat to foods like pasta or stew.  Use beans or vegetables instead of meat in common dishes like chili or lasagna.  Experiment with different cooking methods. Try roasting or broiling vegetables instead of steaming or sauteing them.  Add frozen vegetables to soups, stews, pasta, or rice.  Add nuts or seeds for added healthy fat at each meal. You can add these to yogurt, salads, or vegetable dishes.  Marinate fish or vegetables using olive oil, lemon  juice, garlic, and fresh herbs. Meal planning   Plan to eat 1 vegetarian meal one day each week. Try to work up to 2 vegetarian meals, if possible.  Eat seafood 2 or more times a week.  Have healthy snacks readily available, such as: ? Vegetable sticks with hummus. ? [place] yogurt. ? Fruit and nut trail mix.  Eat balanced meals throughout the week. This includes: ? Fruit: 2-3 servings a day ? Vegetables: 4-5 servings a day ? Low-fat dairy: 2 servings a day ? Fish, poultry, or lean meat: 1 serving a day ? Beans and legumes: 2 or more servings a week ? Nuts and seeds: 1-2 servings a day ? Whole grains: 6-8 servings a day ? Extra-virgin olive oil: 3-4 servings a day  Limit red meat and sweets to only a few servings a month What are my food choices?  Mediterranean diet ? Recommended  Grains: Whole-grain pasta. Brown rice. Bulgar wheat. Polenta. Couscous. Whole-wheat bread. [place].  Vegetables: Artichokes. Beets. Broccoli. Cabbage. Carrots. Eggplant. Green beans. Chard. Kale. Spinach. Onions. Leeks. Peas. Squash. Tomatoes. Peppers. Radishes.  Fruits: Apples. Apricots. Avocado. Berries. Bananas. Cherries. Dates. Figs. Grapes. Lemons. Melon. Oranges. Peaches. Plums. Pomegranate.  Meats and other protein foods: Beans. Almonds. Sunflower seeds. Pine nuts. Peanuts. [place]. Salmon. Scallops. Shrimp. [place]. Tilapia. Clams. Oysters. Eggs.  Dairy: Low-fat milk. Cheese. Greek yogurt.  Beverages: Water. Red wine. Herbal tea.  Fats and oils: Extra virgin olive oil. Avocado oil. Grape seed oil.  Sweets and desserts: [place] yogurt with honey. Baked apples. Poached pears. Trail mix.  Seasoning and other foods: Basil. Cilantro. Coriander. Cumin. Mint. Parsley. Sage. Rosemary. Tarragon. Garlic. Oregano. Thyme. Pepper. Balsalmic vinegar. Tahini. Hummus. Tomato  sauce. Olives. Mushrooms. ? Limit these  Grains: Prepackaged pasta or rice dishes. Prepackaged cereal with added  sugar.  Vegetables: Deep fried potatoes (french fries).  Fruits: Fruit canned in syrup.  Meats and other protein foods: Beef. Pork. Lamb. Poultry with skin. Hot dogs. Berniece Salines.  Dairy: Ice cream. Sour cream. Whole milk.  Beverages: Juice. Sugar-sweetened soft drinks. Beer. Liquor and spirits.  Fats and oils: Butter. Canola oil. Vegetable oil. Beef fat (tallow). Lard.  Sweets and desserts: Cookies. Cakes. Pies. Candy.  Seasoning and other foods: Mayonnaise. Premade sauces and marinades. The items listed may not be a complete list. Talk with your dietitian about what dietary choices are right for you. Summary  The Mediterranean diet includes both food and lifestyle choices.  Eat a variety of fresh fruits and vegetables, beans, nuts, seeds, and whole grains.  Limit the amount of red meat and sweets that you eat.  Talk with your health care provider about whether it is safe for you to drink red wine in moderation. This means 1 glass a day for nonpregnant women and 2 glasses a day for men. A glass of wine equals 5 oz (150 mL). This information is not intended to replace advice given to you by your health care provider. Make sure you discuss any questions you have with your health care provider. Document Revised: 09/29/2015 Document Reviewed: 09/22/2015 Elsevier Patient Education  [place].

## 2019-07-24 NOTE — Patient Instructions (Addendum)
Medication Instructions:  Stop Colchicine  *If you need a refill on your cardiac medications before your next appointment, please call your pharmacy*   Lab Work: None  If you have labs (blood work) drawn today and your tests are completely normal, you will receive your results only by: Marland Kitchen MyChart Message (if you have MyChart) OR . A paper copy in the mail If you have any lab test that is abnormal or we need to change your treatment, we will call you to review the results.   Testing/Procedures: None   Follow-Up: At [facility], you and your health needs are our priority.  As part of our continuing mission to provide you with exceptional heart care, we have created designated Provider Care Teams.  These Care Teams include your primary Cardiologist (physician) and Advanced Practice Providers (APPs -  Physician Assistants and Nurse Practitioners) who all work together to provide you with the care you need, when you need it.  Your next appointment:   3 month(s)  The format for your next appointment:   In Person  Provider:   Cherlynn Kaiser, MD     [place] refers to food and lifestyle choices that are based on the traditions of countries located on the [place]. This way of eating has been shown to help prevent certain conditions and improve outcomes for people who have chronic diseases, like kidney disease and heart disease. What are tips for following this plan? Lifestyle  Cook and eat meals together with your family, when possible.  Drink enough fluid to keep your urine clear or pale yellow.  Be physically active every day. This includes: ? Aerobic exercise like running or swimming. ? Leisure activities like gardening, walking, or housework.  Get 7-8 hours of sleep each night.  If recommended by your health care provider, drink red wine in moderation. This means 1 glass a day for nonpregnant women and 2 glasses a day for men. A  glass of wine equals 5 oz (150 mL). Reading food labels   Check the serving size of packaged foods. For foods such as rice and pasta, the serving size refers to the amount of cooked product, not dry.  Check the total fat in packaged foods. Avoid foods that have saturated fat or trans fats.  Check the ingredients list for added sugars, such as corn syrup. Shopping  At the grocery store, buy most of your food from the areas near the walls of the store. This includes: ? Fresh fruits and vegetables (produce). ? Grains, beans, nuts, and seeds. Some of these may be available in unpackaged forms or large amounts (in bulk). ? Fresh seafood. ? Poultry and eggs. ? Low-fat dairy products.  Buy whole ingredients instead of prepackaged foods.  Buy fresh fruits and vegetables in-season from local farmers markets.  Buy frozen fruits and vegetables in resealable bags.  If you do not have access to quality fresh seafood, buy precooked frozen shrimp or canned fish, such as tuna, salmon, or sardines.  Buy small amounts of raw or cooked vegetables, salads, or olives from the deli or salad bar at your store.  Stock your pantry so you always have certain foods on hand, such as olive oil, canned tuna, canned tomatoes, rice, pasta, and beans. Cooking  Cook foods with extra-virgin olive oil instead of using butter or other vegetable oils.  Have meat as a side dish, and have vegetables or grains as your main dish. This means having meat in  small portions or adding small amounts of meat to foods like pasta or stew.  Use beans or vegetables instead of meat in common dishes like chili or lasagna.  Experiment with different cooking methods. Try roasting or broiling vegetables instead of steaming or sauteing them.  Add frozen vegetables to soups, stews, pasta, or rice.  Add nuts or seeds for added healthy fat at each meal. You can add these to yogurt, salads, or vegetable dishes.  Marinate fish or  vegetables using olive oil, lemon juice, garlic, and fresh herbs. Meal planning   Plan to eat 1 vegetarian meal one day each week. Try to work up to 2 vegetarian meals, if possible.  Eat seafood 2 or more times a week.  Have healthy snacks readily available, such as: ? Vegetable sticks with hummus. ? [place] yogurt. ? Fruit and nut trail mix.  Eat balanced meals throughout the week. This includes: ? Fruit: 2-3 servings a day ? Vegetables: 4-5 servings a day ? Low-fat dairy: 2 servings a day ? Fish, poultry, or lean meat: 1 serving a day ? Beans and legumes: 2 or more servings a week ? Nuts and seeds: 1-2 servings a day ? Whole grains: 6-8 servings a day ? Extra-virgin olive oil: 3-4 servings a day  Limit red meat and sweets to only a few servings a month What are my food choices?  Mediterranean diet ? Recommended  Grains: Whole-grain pasta. Brown rice. Bulgar wheat. Polenta. Couscous. Whole-wheat bread. [place].  Vegetables: Artichokes. Beets. Broccoli. Cabbage. Carrots. Eggplant. Green beans. Chard. Kale. Spinach. Onions. Leeks. Peas. Squash. Tomatoes. Peppers. Radishes.  Fruits: Apples. Apricots. Avocado. Berries. Bananas. Cherries. Dates. Figs. Grapes. Lemons. Melon. Oranges. Peaches. Plums. Pomegranate.  Meats and other protein foods: Beans. Almonds. Sunflower seeds. Pine nuts. Peanuts. [place]. Salmon. Scallops. Shrimp. [place]. Tilapia. Clams. Oysters. Eggs.  Dairy: Low-fat milk. Cheese. Greek yogurt.  Beverages: Water. Red wine. Herbal tea.  Fats and oils: Extra virgin olive oil. Avocado oil. Grape seed oil.  Sweets and desserts: [place] yogurt with honey. Baked apples. Poached pears. Trail mix.  Seasoning and other foods: Basil. Cilantro. Coriander. Cumin. Mint. Parsley. Sage. Rosemary. Tarragon. Garlic. Oregano. Thyme. Pepper. Balsalmic vinegar. Tahini. Hummus. Tomato sauce. Olives. Mushrooms. ? Limit these  Grains: Prepackaged pasta or rice dishes. Prepackaged  cereal with added sugar.  Vegetables: Deep fried potatoes (french fries).  Fruits: Fruit canned in syrup.  Meats and other protein foods: Beef. Pork. Lamb. Poultry with skin. Hot dogs. [place] Salines.  Dairy: Ice cream. Sour cream. Whole milk.  Beverages: Juice. Sugar-sweetened soft drinks. Beer. Liquor and spirits.  Fats and oils: Butter. Canola oil. Vegetable oil. Beef fat (tallow). Lard.  Sweets and desserts: Cookies. Cakes. Pies. Candy.  Seasoning and other foods: Mayonnaise. Premade sauces and marinades. The items listed may not be a complete list. Talk with your dietitian about what dietary choices are right for you. Summary  The Mediterranean diet includes both food and lifestyle choices.  Eat a variety of fresh fruits and vegetables, beans, nuts, seeds, and whole grains.  Limit the amount of red meat and sweets that you eat.  Talk with your health care provider about whether it is safe for you to drink red wine in moderation. This means 1 glass a day for nonpregnant women and 2 glasses a day for men. A glass of wine equals 5 oz (150 mL). This information is not intended to replace advice given to you by your health care provider. Make sure you discuss any  questions you have with your health care provider. Document Revised: 09/29/2015 Document Reviewed: 09/22/2015 Elsevier Patient Education  [place].

## 2019-08-06 ENCOUNTER — Ambulatory Visit: Payer: 59 | Admitting: Internal Medicine

## 2019-10-26 ENCOUNTER — Ambulatory Visit: Payer: 59 | Admitting: Internal Medicine

## 2019-11-26 ENCOUNTER — Encounter: Payer: Self-pay | Admitting: Internal Medicine

## 2019-11-26 ENCOUNTER — Ambulatory Visit (INDEPENDENT_AMBULATORY_CARE_PROVIDER_SITE_OTHER): Payer: 59 | Admitting: Internal Medicine

## 2019-11-26 ENCOUNTER — Other Ambulatory Visit: Payer: Self-pay

## 2019-11-26 VITALS — BP 99/61 | HR 69 | Ht 61.5 in | Wt 138.6 lb

## 2019-11-26 DIAGNOSIS — I3139 Other pericardial effusion (noninflammatory): Secondary | ICD-10-CM

## 2019-11-26 DIAGNOSIS — I313 Pericardial effusion (noninflammatory): Secondary | ICD-10-CM | POA: Diagnosis not present

## 2019-11-26 DIAGNOSIS — M329 Systemic lupus erythematosus, unspecified: Secondary | ICD-10-CM

## 2019-11-26 NOTE — Progress Notes (Signed)
Cardiology Office Note:    Date:  11/26/2019   ID:  Victoria Stokes, DOB 1955/10/09, MRN [id number]  PCP:  Kathyrn Lass, MD  Cardiologist:  Elouise Munroe, MD  Electrophysiologist:  None   Referring MD: Kathyrn Lass, MD   Chief Complaint/Reason for Referral: pericardial effusion, lupus  History of Present Illness:    Victoria Stokes is a 64 y.o. female with a history of chronic hyponatremia complicated by seizures, MGUS followed at [facility], who I initially met in the hospital with a presentation of chest pain and increasing shortness of breath with low-grade fever for 2 to 3 weeks.  While in hospital she was noted to have a small to moderate pericardial effusion without tamponade physiology, and repeat echocardiogram in the office in April demonstrates near resolution of pericardial effusion.   No chest pain, no shortness of breath currently. She has been diagnosed with lupus, and has been placed on hydroxychloroquine 200 mg daily. We discussed annual echo for PHTN screening in setting of Lupus.   Overall doing well. Husband present for visit. We discussed safety of vaccinations - I recommend review with PCP however no concern from cardiac standpoint to receive age related recommended vaccinations.   Past Medical History:  Diagnosis Date  . Low sodium levels    Hx  . Seizures ([place]) 10/29/2016    Past Surgical History:  Procedure Laterality Date  . CESAREAN SECTION      Current Medications: Current Meds  Medication Sig  . B Complex Vitamins (VITAMIN B-COMPLEX PO) Take 1 tablet by mouth daily.   . Cholecalciferol (VITAMIN D PO) Take 5,000 Units by mouth daily.   . hydroxychloroquine (PLAQUENIL) 200 MG tablet Take 200 mg by mouth daily.  . Melatonin 10 MG TABS Take 10 mg by mouth at bedtime.   . sodium chloride 1 g tablet Take 1 tablet (1 g total) by mouth daily.  . valACYclovir (VALTREX) 500 MG tablet Take 500 mg by mouth 2 (two) times daily as needed. Cold sores  . VITAMIN  A PO Take 1 tablet by mouth daily.   Marland Kitchen VITAMIN K PO Take 1 tablet by mouth daily.   Marland Kitchen zolpidem (AMBIEN) 5 MG tablet Take 5 mg by mouth as needed for sleep.     Allergies:   Gluten meal   Social History   Tobacco Use  . Smoking status: Never Smoker  . Smokeless tobacco: Never Used  Vaping Use  . Vaping Use: Never used  Substance Use Topics  . Alcohol use: Yes    Alcohol/week: 0.0 standard drinks    Comment: 2 drinks per month  . Drug use: No     Family History: The patient's family history includes Breast cancer in her mother; Pancreatic cancer in her father. There is no history of Seizures.  ROS:   Please see the history of present illness.    All other systems reviewed and are negative.  EKGs/Labs/Other Studies Reviewed:    The following studies were reviewed today:  EKG:  SR 1st deg AVB   Recent Labs: 04/30/2019: ALT 18; TSH 1.312 05/11/2019: BUN 8; Creatinine, Ser 0.64; Hemoglobin 10.5; Platelets 485; Potassium 3.6; Sodium 133  Recent Lipid Panel No results found for: CHOL, TRIG, HDL, CHOLHDL, VLDL, LDLCALC, LDLDIRECT  Physical Exam:    VS:  BP 99/61   Pulse 69   Ht 5' 1.5" (1.562 m)   Wt 138 lb 9.6 oz (62.9 kg)   SpO2 97%   BMI 25.76  kg/m     Wt Readings from Last 5 Encounters:  11/26/19 138 lb 9.6 oz (62.9 kg)  07/24/19 128 lb 12.8 oz (58.4 kg)  06/03/19 124 lb (56.2 kg)  05/06/19 138 lb 14.2 oz (63 kg)  04/30/19 140 lb (63.5 kg)    Constitutional: No acute distress Cardiovascular: regular rhythm, normal rate, no murmurs. S1 and S2 normal. Radial pulses normal bilaterally. No jugular venous distention.  Respiratory: clear to auscultation bilaterally GI : normal bowel sounds, soft and nontender. No distention.   MSK: extremities warm, well perfused. No edema.  NEURO: grossly nonfocal exam, moves all extremities. PSYCH: alert and oriented x 3, normal mood and affect.   ASSESSMENT:    1. Pericardial effusion   2. Lupus ([place])    PLAN:     Pericardial effusion  Lupus ([place]) - Plan: EKG 12-Lead, ECHOCARDIOGRAM COMPLETE   Will obtain annual echocardiogram to screen for pulm hypertension. She is doing really well otherwise. We reviewed health maintence steps including age related recommended vaccinations and heart healthy diet and exercise recommendations.   Total time of encounter: 30 minutes total time of encounter, including 20 minutes spent in face-to-face patient care on the date of this encounter. This time includes coordination of care and counseling regarding above mentioned problem list. Remainder of non-face-to-face time involved reviewing chart documents/testing relevant to the patient encounter and documentation in the medical record. I have independently reviewed documentation from referring provider.   Cherlynn Kaiser, MD [place]  [facility]    Medication Adjustments/Labs and Tests Ordered: Current medicines are reviewed at length with the patient today.  Concerns regarding medicines are outlined above.   Orders Placed This Encounter  Procedures  . EKG 12-Lead  . ECHOCARDIOGRAM COMPLETE    No orders of the defined types were placed in this encounter.   Patient Instructions  Medication Instructions:  No Changes In Medications at this time.  *If you need a refill on your cardiac medications before your next appointment, please call your pharmacy*  Lab Work: None Ordered At This Time.  If you have labs (blood work) drawn today and your tests are completely normal, you will receive your results only by: Marland Kitchen MyChart Message (if you have MyChart) OR . A paper copy in the mail If you have any lab test that is abnormal or we need to change your treatment, we will call you to review the results.  Testing/Procedures: Your physician has requested that you have an echocardiogram in 6 MONTHS. Echocardiography is a painless test that uses sound waves to create images of your heart. It provides your doctor  with information about the size and shape of your heart and how well your heart's chambers and valves are working. You may receive an ultrasound enhancing agent through an IV if needed to better visualize your heart during the echo.This procedure takes approximately one hour. There are no restrictions for this procedure. This will take place at the [address].   Follow-Up: At [facility], you and your health needs are our priority.  As part of our continuing mission to provide you with exceptional heart care, we have created designated Provider Care Teams.  These Care Teams include your primary Cardiologist (physician) and Advanced Practice Providers (APPs -  Physician Assistants and Nurse Practitioners) who all work together to provide you with the care you need, when you need it.  Your next appointment:   6 month(s)   The format for your  next appointment:   In Person  Provider:   Cherlynn Kaiser, MD

## 2019-11-26 NOTE — Patient Instructions (Signed)
Medication Instructions:  No Changes In Medications at this time.  *If you need a refill on your cardiac medications before your next appointment, please call your pharmacy*  Lab Work: None Ordered At This Time.  If you have labs (blood work) drawn today and your tests are completely normal, you will receive your results only by: Marland Kitchen MyChart Message (if you have MyChart) OR . A paper copy in the mail If you have any lab test that is abnormal or we need to change your treatment, we will call you to review the results.  Testing/Procedures: Your physician has requested that you have an echocardiogram in 6 MONTHS. Echocardiography is a painless test that uses sound waves to create images of your heart. It provides your doctor with information about the size and shape of your heart and how well your heart's chambers and valves are working. You may receive an ultrasound enhancing agent through an IV if needed to better visualize your heart during the echo.This procedure takes approximately one hour. There are no restrictions for this procedure. This will take place at the [address].   Follow-Up: At [facility], you and your health needs are our priority.  As part of our continuing mission to provide you with exceptional heart care, we have created designated Provider Care Teams.  These Care Teams include your primary Cardiologist (physician) and Advanced Practice Providers (APPs -  Physician Assistants and Nurse Practitioners) who all work together to provide you with the care you need, when you need it.  Your next appointment:   6 month(s)   The format for your next appointment:   In Person  Provider:   Cherlynn Kaiser, MD

## 2020-05-26 ENCOUNTER — Ambulatory Visit (HOSPITAL_COMMUNITY): Payer: 59 | Attending: Internal Medicine

## 2020-05-26 ENCOUNTER — Other Ambulatory Visit: Payer: Self-pay

## 2020-05-26 DIAGNOSIS — M329 Systemic lupus erythematosus, unspecified: Secondary | ICD-10-CM

## 2020-05-26 LAB — ECHOCARDIOGRAM COMPLETE
Area-P 1/2: 2.33 cm2
S' Lateral: 2.5 cm

## 2020-06-10 ENCOUNTER — Ambulatory Visit (INDEPENDENT_AMBULATORY_CARE_PROVIDER_SITE_OTHER): Payer: 59 | Admitting: Internal Medicine

## 2020-06-10 ENCOUNTER — Encounter: Payer: Self-pay | Admitting: Internal Medicine

## 2020-06-10 ENCOUNTER — Other Ambulatory Visit: Payer: Self-pay

## 2020-06-10 VITALS — BP 108/64 | HR 59 | Ht 67.0 in | Wt 147.0 lb

## 2020-06-10 DIAGNOSIS — I3139 Other pericardial effusion (noninflammatory): Secondary | ICD-10-CM

## 2020-06-10 DIAGNOSIS — I700 Atherosclerosis of aorta: Secondary | ICD-10-CM

## 2020-06-10 DIAGNOSIS — I318 Other specified diseases of pericardium: Secondary | ICD-10-CM | POA: Diagnosis not present

## 2020-06-10 DIAGNOSIS — M359 Systemic involvement of connective tissue, unspecified: Secondary | ICD-10-CM

## 2020-06-10 DIAGNOSIS — M329 Systemic lupus erythematosus, unspecified: Secondary | ICD-10-CM

## 2020-06-10 DIAGNOSIS — E871 Hypo-osmolality and hyponatremia: Secondary | ICD-10-CM

## 2020-06-10 DIAGNOSIS — I313 Pericardial effusion (noninflammatory): Secondary | ICD-10-CM

## 2020-06-10 DIAGNOSIS — E785 Hyperlipidemia, unspecified: Secondary | ICD-10-CM

## 2020-06-10 DIAGNOSIS — I471 Supraventricular tachycardia: Secondary | ICD-10-CM

## 2020-06-10 NOTE — Patient Instructions (Signed)
Medication Instructions:  Your physician recommends that you continue on your current medications as directed. Please refer to the Current Medication list given to you today.  *If you need a refill on your cardiac medications before your next appointment, please call your pharmacy*   Follow-Up: At [facility], you and your health needs are our priority.  As part of our continuing mission to provide you with exceptional heart care, we have created designated Provider Care Teams.  These Care Teams include your primary Cardiologist (physician) and Advanced Practice Providers (APPs -  Physician Assistants and Nurse Practitioners) who all work together to provide you with the care you need, when you need it.  We recommend signing up for the patient portal called "MyChart".  Sign up information is provided on this After Visit Summary.  MyChart is used to connect with patients for Virtual Visits (Telemedicine).  Patients are able to view lab/test results, encounter notes, upcoming appointments, etc.  Non-urgent messages can be sent to your provider as well.   To learn more about what you can do with MyChart, go to [email or web address].    Your next appointment:   12 month(s)  The format for your next appointment:   In Person  Provider:   Gayatri Acharya, MD 

## 2020-06-10 NOTE — Progress Notes (Signed)
Cardiology Office Note:    Date:  06/10/2020   ID:  Victoria Stokes, DOB [birth year], MRN [id number]  PCP:  Gweneth Dimitri, MD  Cardiologist:  Parke Poisson, MD  Electrophysiologist:  None   Referring MD: Sigmund Hazel, MD   Chief Complaint/Reason for Referral: Pericardial effusion, lupus  History of Present Illness:    Victoria Stokes is a 65 y.o. female with a history of chronic hyponatremia complicated by seizures, MGUS followed at [facility], who I initially met in the hospital with a presentation of chest pain and increasing shortness of breath with low-grade fever for 2 to 3 weeks.  While in hospital she was noted to have a small to moderate pericardial effusion without tamponade physiology, and repeat echocardiogram in the office in April demonstrates near resolution of pericardial effusion. Recent echo shows no pericardial effusion.  The patient denies chest pain, chest pressure, dyspnea at rest or with exertion, palpitations, PND, orthopnea, or leg swelling. Denies cough, fever, chills. Denies nausea, vomiting. Denies syncope or presyncope. Denies dizziness or lightheadedness.  She is expecting her first grandchild (baby girl) on May 31.  Past Medical History:  Diagnosis Date  . Low sodium levels    Hx  . Seizures ([facility]) 10/29/2016    Past Surgical History:  Procedure Laterality Date  . CESAREAN SECTION      Current Medications: Current Meds  Medication Sig  . B Complex Vitamins (VITAMIN B-COMPLEX PO) Take 1 tablet by mouth daily.   . Cholecalciferol (VITAMIN D PO) Take 5,000 Units by mouth daily.   . hydroxychloroquine (PLAQUENIL) 200 MG tablet Take 200 mg by mouth daily.  . Melatonin 10 MG TABS Take 10 mg by mouth at bedtime.  . sodium chloride 1 g tablet Take 1 tablet (1 g total) by mouth daily.  . valACYclovir (VALTREX) 500 MG tablet Take 500 mg by mouth 2 (two) times daily as needed. Cold sores  . VITAMIN A PO Take 1 tablet by mouth daily.   Marland Kitchen VITAMIN K PO Take 1  tablet by mouth daily.   Marland Kitchen zolpidem (AMBIEN) 5 MG tablet Take 5 mg by mouth as needed for sleep.     Allergies:   Gluten meal   Social History   Tobacco Use  . Smoking status: Never Smoker  . Smokeless tobacco: Never Used  Vaping Use  . Vaping Use: Never used  Substance Use Topics  . Alcohol use: Yes    Alcohol/week: 0.0 standard drinks    Comment: 2 drinks per month  . Drug use: No     Family History: The patient's family history includes Breast cancer in her mother; Pancreatic cancer in her father. There is no history of Seizures.  ROS:   Please see the history of present illness.    All other systems reviewed and are negative.  EKGs/Labs/Other Studies Reviewed:    The following studies were reviewed today:  EKG: Sinus bradycardia with first-degree AV block, rate 59, PR interval 216 ms.  Compared to the prior ECG 11/26/2019, heart rate is slower.  I have independently reviewed the images from CT angio chest PE 04/30/2019.  I have reviewed these images with the patient.  Mild aortic atherosclerosis, no definite coronary artery calcifications.  Small pericardial effusion.  Recent Labs: No results found for requested labs within last 8760 hours.  Recent Lipid Panel No results found for: CHOL, TRIG, HDL, CHOLHDL, VLDL, LDLCALC, LDLDIRECT  Physical Exam:    VS:  BP 108/64  Pulse (!) 59   Ht $R'5\' 7"'TA$  (1.702 m)   Wt 147 lb (66.7 kg)   BMI 23.02 kg/m     Wt Readings from Last 5 Encounters:  06/10/20 147 lb (66.7 kg)  11/26/19 138 lb 9.6 oz (62.9 kg)  07/24/19 128 lb 12.8 oz (58.4 kg)  06/03/19 124 lb (56.2 kg)  05/06/19 138 lb 14.2 oz (63 kg)    Constitutional: No acute distress Eyes: sclera non-icteric, normal conjunctiva and lids ENMT: normal dentition, moist mucous membranes Cardiovascular: regular rhythm, normal rate, no murmurs. S1 and S2 normal. Radial pulses normal bilaterally. No jugular venous distention.  Respiratory: clear to auscultation bilaterally GI  : normal bowel sounds, soft and nontender. No distention.   MSK: extremities warm, well perfused. No edema.  NEURO: grossly nonfocal exam, moves all extremities. PSYCH: alert and oriented x 3, normal mood and affect.   ASSESSMENT:    1. Pericardial effusion   2. Other pericarditis, unspecified chronicity   3. Lupus ([place])   4. Connective tissue disorder ([place])   5. Atrial tachycardia ([place])   6. Hyponatremia   7. Aortic atherosclerosis ([place])   8. Hyperlipidemia, unspecified hyperlipidemia type    PLAN:    Pericardial effusion - Plan: EKG 12-Lead Other pericarditis, unspecified chronicity -No recurrent chest pain.  Mild septal shutter on echo with no other features of constriction, we will follow this clinically.  Lupus (HCC)-stable per patient, continues on Plaquenil. Connective tissue disorder (HCC)  Atrial tachycardia (HCC)-no palpitations, overall feeling well  Hyponatremia-she states this is not related to excessive volume intake.  Last sodium 127.  We discussed that from a cardiac perspective, it is safe to continue salt supplementation and monitor symptoms.  No features of diastolic/constrictive heart failure.  Aortic atherosclerosis HLD -this is mild and present on her CT from March 2021.  We discussed indications for statin therapy.  This can be observed, however would recommend diet and lifestyle modification for management of elevated LDL of 133.  If no improvement in lipids with a goal LDL of less than 70, would consider statin therapy for these indications.  Total time of encounter: 30 minutes total time of encounter, including 20 minutes spent in face-to-face patient care on the date of this encounter. This time includes coordination of care and counseling regarding above mentioned problem list. Remainder of non-face-to-face time involved reviewing chart documents/testing relevant to the patient encounter and documentation in the medical record. I have independently  reviewed documentation from referring provider.   Cherlynn Kaiser, MD, [place]   Shared Decision Making/Informed Consent:       Medication Adjustments/Labs and Tests Ordered: Current medicines are reviewed at length with the patient today.  Concerns regarding medicines are outlined above.   Orders Placed This Encounter  Procedures  . EKG 12-Lead    No orders of the defined types were placed in this encounter.   Patient Instructions  Medication Instructions:  Your physician recommends that you continue on your current medications as directed. Please refer to the Current Medication list given to you today.  *If you need a refill on your cardiac medications before your next appointment, please call your pharmacy*   Follow-Up: At [facility], you and your health needs are our priority.  As part of our continuing mission to provide you with exceptional heart care, we have created designated Provider Care Teams.  These Care Teams include your primary Cardiologist (physician) and Advanced Practice Providers (APPs -  Physician  Assistants and Nurse Practitioners) who all work together to provide you with the care you need, when you need it.  We recommend signing up for the patient portal called "MyChart".  Sign up information is provided on this After Visit Summary.  MyChart is used to connect with patients for Virtual Visits (Telemedicine).  Patients are able to view lab/test results, encounter notes, upcoming appointments, etc.  Non-urgent messages can be sent to your provider as well.   To learn more about what you can do with MyChart, go to [email or web address].    Your next appointment:   12 month(s)  The format for your next appointment:   In Person  Provider:   Cherlynn Kaiser, MD

## 2021-04-07 IMAGING — CT CT ANGIO CHEST
2 of 6 series · 17 of 36 positions shown · IV contrast (omnipaque)
Comparison: None.

CLINICAL DATA: Abdominal distension. Shortness of breath. Fever.

EXAM:
CT ANGIOGRAPHY CHEST
CT ABDOMEN AND PELVIS WITH CONTRAST
TECHNIQUE: Multidetector CT imaging of the chest was performed using the
standard protocol during bolus administration of intravenous
contrast. Multiplanar CT image reconstructions and MIPs were
obtained to evaluate the vascular anatomy. Multidetector CT imaging
of the abdomen and pelvis was performed using the standard protocol
during bolus administration of intravenous contrast.
CONTRAST:  100mL OMNIPAQUE IOHEXOL 350 MG/ML SOLN

[Series 5: pe thins · axial · 0.72mm/px · z∈[+803,+1029]mm · 16 of 256 slices shown]
[im 15/256  lung]
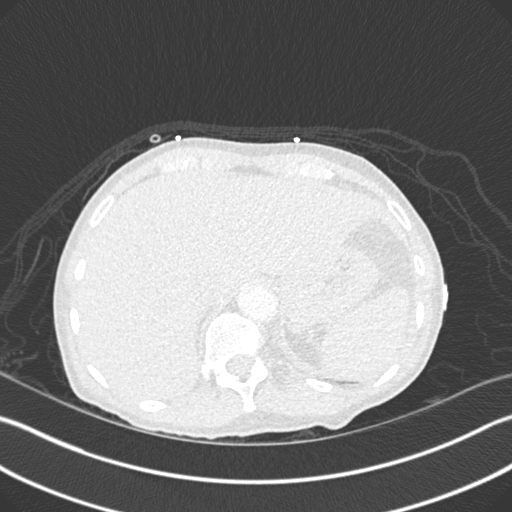
[im 29/256  mediastinal]
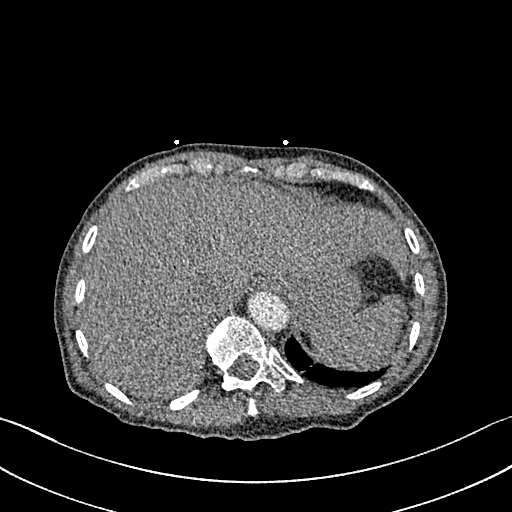
[im 43/256  lung]
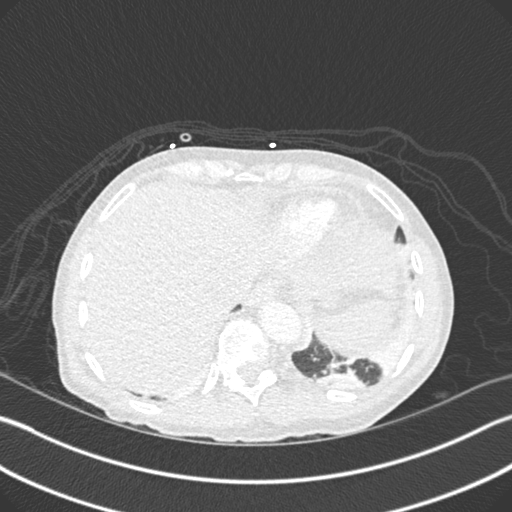
[im 57/256  mediastinal]
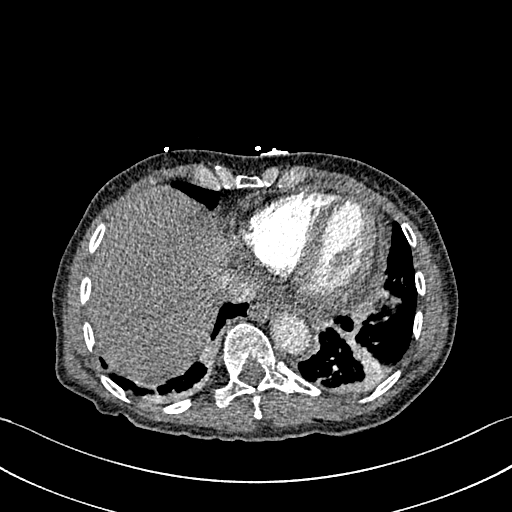
[im 71/256  lung]
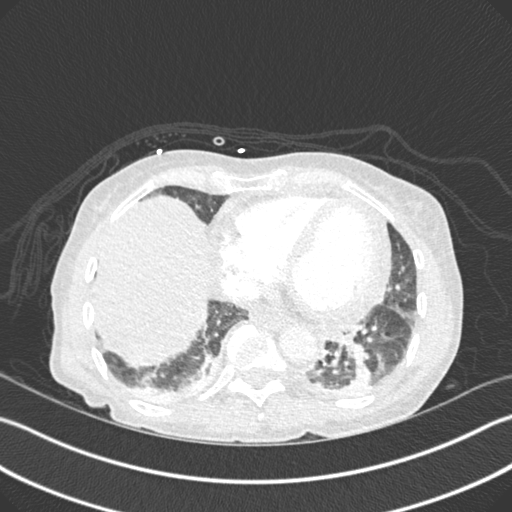
[im 86/256  mediastinal]
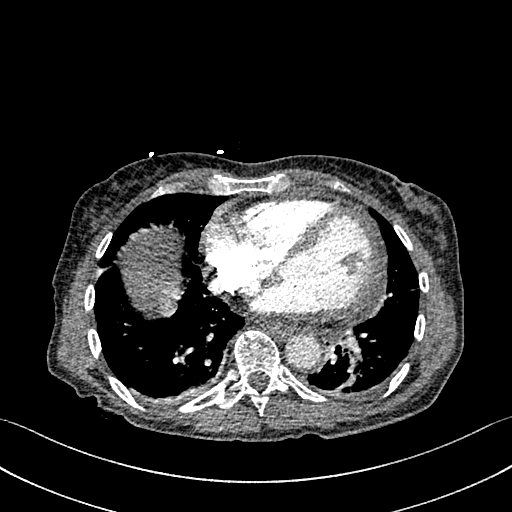
[im 100/256  lung]
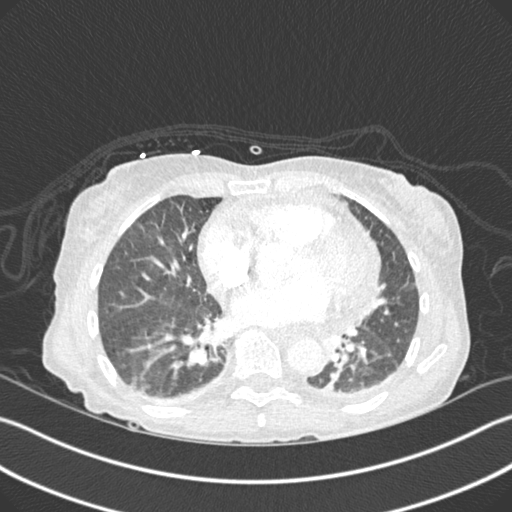
[im 114/256  mediastinal]
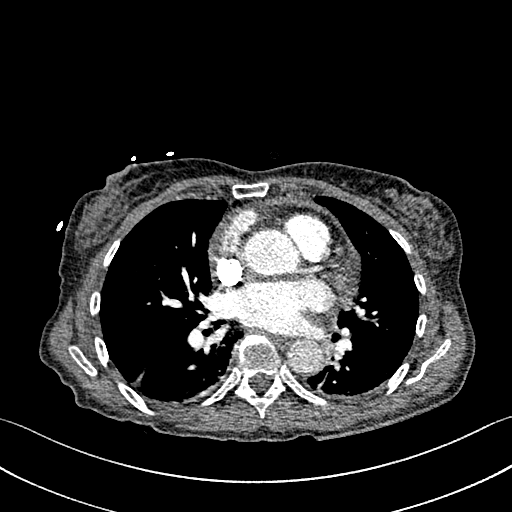
[im 142/256  lung]
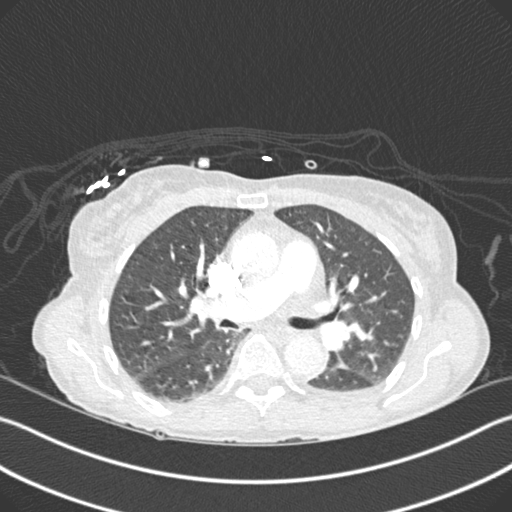
[im 156/256  mediastinal]
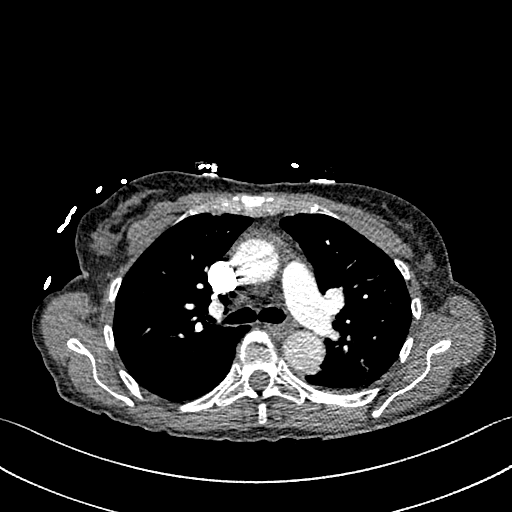
[im 171/256  lung]
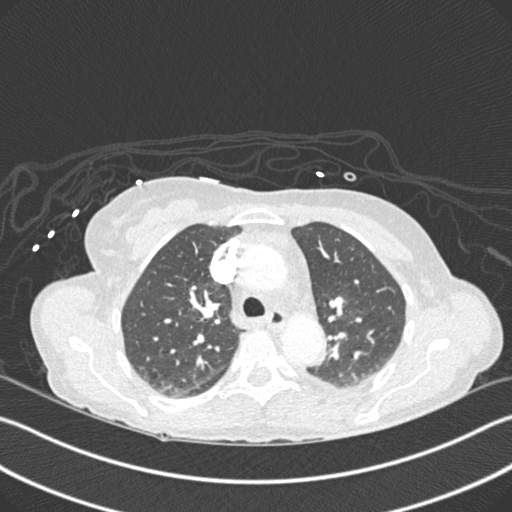
[im 185/256  mediastinal]
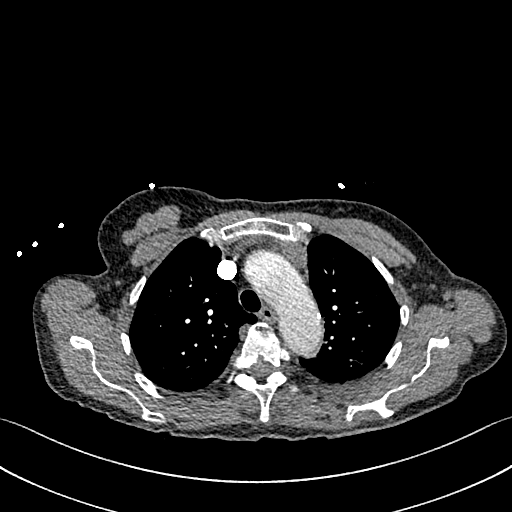
[im 199/256  lung]
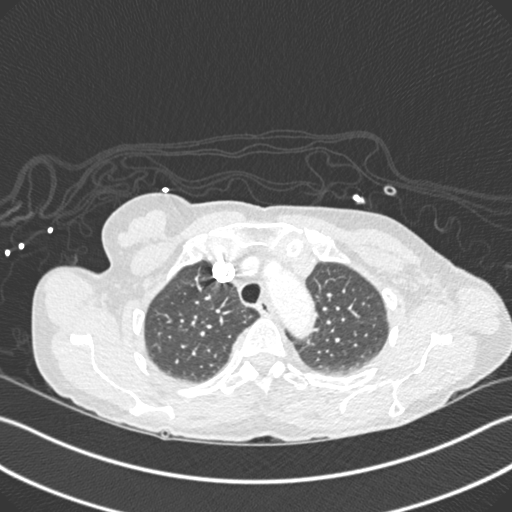
[im 213/256  mediastinal]
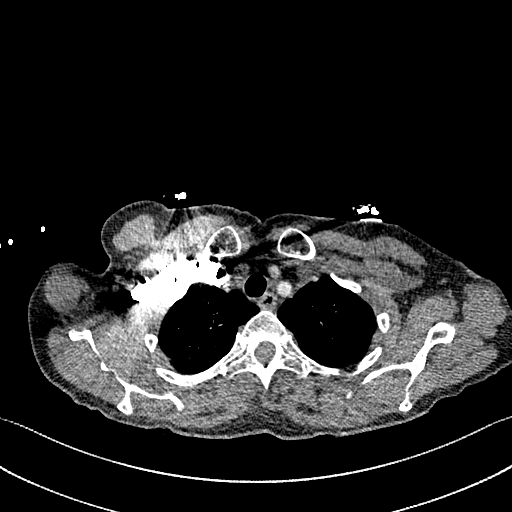
[im 227/256  lung]
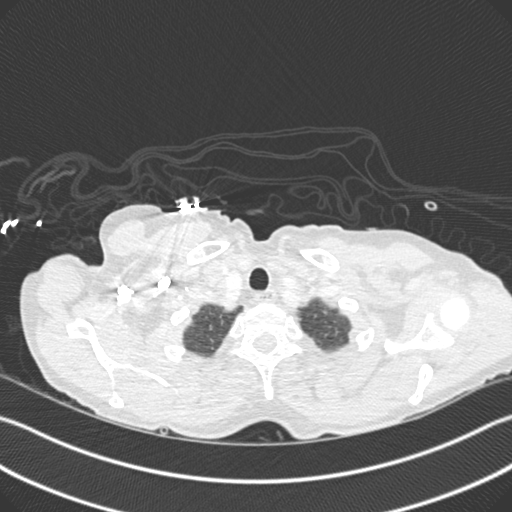
[im 241/256  mediastinal]
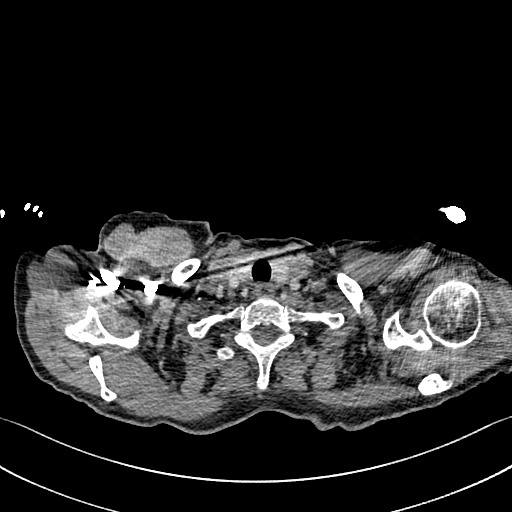

[Series 6: pe 2mm cor · coronal · 0.52mm/px · 1 of 118 slices shown]
[im 59/118  mediastinal]
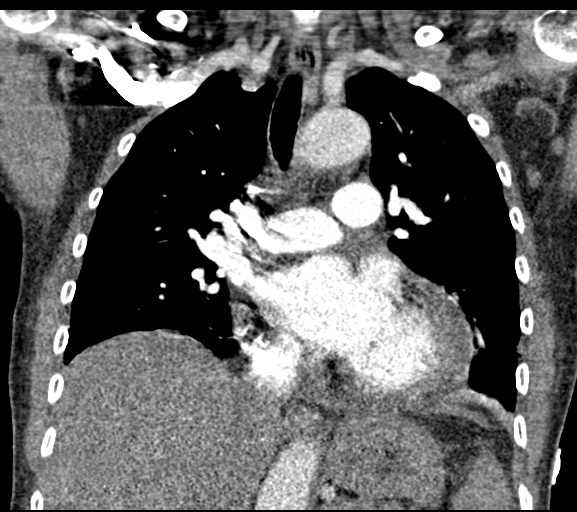

[17 of 36 positions shown; findings below may reference images not displayed]

FINDINGS: CTA CHEST FINDINGS

Cardiovascular: There is no acute pulmonary embolism. The heart size
is normal. There is a trace pericardial effusion. No evidence for
thoracic aortic aneurysm.

Mediastinum/Nodes:

--No mediastinal or hilar lymphadenopathy.

--No axillary lymphadenopathy.

--No supraclavicular lymphadenopathy.

--Normal thyroid gland.

--The esophagus is unremarkable

Lungs/Pleura: There is atelectasis at the lung bases. There is no
large pneumothorax. There are trace bilateral pleural effusions,
left greater than right.

Musculoskeletal: There is an age-indeterminate compression fracture
of the T4 vertebral body, favored to be chronic. There is no acute
displaced fracture.

Review of the MIP images confirms the above findings.

CT ABDOMEN and PELVIS FINDINGS

Hepatobiliary: There is periportal edema. There is mild gallbladder
wall thickening which is nonspecific.There is no biliary ductal
dilation.

Pancreas: Normal contours without ductal dilatation. No
peripancreatic fluid collection.

Spleen: No splenic laceration or hematoma.

Adrenals/Urinary Tract:

--Adrenal glands: No adrenal hemorrhage.

--Right kidney/ureter: No hydronephrosis or perinephric hematoma.

--Left kidney/ureter: No hydronephrosis or perinephric hematoma.

--Urinary bladder: Unremarkable.

Stomach/Bowel:

--Stomach/Duodenum: No hiatal hernia or other gastric abnormality.
Normal duodenal course and caliber.

--Small bowel: No dilatation or inflammation.

--Colon: No focal abnormality.

--Appendix: Not visualized. No right lower quadrant inflammation or
free fluid.

Vascular/Lymphatic: Atherosclerotic calcification is present within
the non-aneurysmal abdominal aorta, without hemodynamically
significant stenosis.

--No retroperitoneal lymphadenopathy.

--No mesenteric lymphadenopathy.

--No pelvic or inguinal lymphadenopathy.

Reproductive: Unremarkable

Other: No ascites or free air. The abdominal wall is normal.

Musculoskeletal. No acute displaced fractures.

Review of the MIP images confirms the above findings.
IMPRESSION: 1. No evidence of acute pulmonary embolism.
2. Trace bilateral pleural effusions, left greater than right.
3. Age-indeterminate compression fracture of the T4 vertebral body,
favored to be chronic.
4. Periportal edema with mild gallbladder wall thickening, which is
nonspecific. Findings may be secondary to the patient's volume
status or underlying hepatocellular disease. Correlation with
laboratory studies is recommended.
5. Trace pericardial effusion.

## 2021-09-18 ENCOUNTER — Ambulatory Visit: Payer: 59 | Admitting: Internal Medicine

## 2022-02-19 ENCOUNTER — Telehealth: Payer: Self-pay | Admitting: Internal Medicine

## 2022-02-19 NOTE — Telephone Encounter (Signed)
   STAT if patient feels like he/she is going to faint   Are you dizzy now? Yes, but she is going to see her PCP this morning at 10:30   Do you feel faint or have you passed out? No   Do you have any other symptoms?  Exhaustion   Have you checked your HR and BP (record if available)? No   Pt states she has felt very lightheaded like she is going to faint. First time it occurred was 12/24 and it happened again 02/16/22. She states she is not feeling so well this morning and she has an appt with PCP today at 10:30. Pt wants to know if this is related to her lupus with pericarditis. Please advise.

## 2022-02-20 ENCOUNTER — Encounter: Payer: Self-pay | Admitting: Nurse Practitioner

## 2022-02-20 ENCOUNTER — Ambulatory Visit (HOSPITAL_BASED_OUTPATIENT_CLINIC_OR_DEPARTMENT_OTHER): Payer: 59

## 2022-02-20 ENCOUNTER — Ambulatory Visit: Payer: 59 | Attending: Nurse Practitioner | Admitting: Nurse Practitioner

## 2022-02-20 VITALS — BP 132/72 | HR 70 | Ht 67.0 in | Wt 148.4 lb

## 2022-02-20 DIAGNOSIS — R42 Dizziness and giddiness: Secondary | ICD-10-CM

## 2022-02-20 DIAGNOSIS — R55 Syncope and collapse: Secondary | ICD-10-CM | POA: Diagnosis not present

## 2022-02-20 DIAGNOSIS — I3139 Other pericardial effusion (noninflammatory): Secondary | ICD-10-CM | POA: Diagnosis present

## 2022-02-20 DIAGNOSIS — E871 Hypo-osmolality and hyponatremia: Secondary | ICD-10-CM | POA: Insufficient documentation

## 2022-02-20 DIAGNOSIS — R002 Palpitations: Secondary | ICD-10-CM | POA: Diagnosis not present

## 2022-02-20 DIAGNOSIS — R072 Precordial pain: Secondary | ICD-10-CM | POA: Diagnosis not present

## 2022-02-20 DIAGNOSIS — I700 Atherosclerosis of aorta: Secondary | ICD-10-CM | POA: Diagnosis present

## 2022-02-20 DIAGNOSIS — M329 Systemic lupus erythematosus, unspecified: Secondary | ICD-10-CM | POA: Diagnosis present

## 2022-02-20 DIAGNOSIS — I4719 Other supraventricular tachycardia: Secondary | ICD-10-CM | POA: Diagnosis present

## 2022-02-20 DIAGNOSIS — E785 Hyperlipidemia, unspecified: Secondary | ICD-10-CM | POA: Insufficient documentation

## 2022-02-20 LAB — ECHOCARDIOGRAM COMPLETE
Area-P 1/2: 3.37 cm2
Height: 67 in
S' Lateral: 2.5 cm
Weight: 2374.4 oz

## 2022-02-20 MED ORDER — METOPROLOL TARTRATE 100 MG PO TABS: ORAL_TABLET | ORAL | 0 refills | Status: DC

## 2022-02-20 NOTE — Patient Instructions (Addendum)
Medication Instructions:  Metoprolol Tartrate 100 mg- take 1 tab 2 hours prior to procedure.  *If you need a refill on your cardiac medications before your next appointment, please call your pharmacy*   Lab Work: NONE ordered at this time of appointment   If you have labs (blood work) drawn today and your tests are completely normal, you will receive your results only by: [place] (if you have MyChart) OR A paper copy in the mail If you have any lab test that is abnormal or we need to change your treatment, we will call you to review the results.   Testing/Procedures: Your physician has recommended that you wear an event monitor. Event monitors are medical devices that record the heart's electrical activity. Doctors most often [place] these monitors to diagnose arrhythmias. Arrhythmias are problems with the speed or rhythm of the heartbeat. The monitor is a small, portable device. You can wear one while you do your normal daily activities. This is usually used to diagnose what is causing palpitations/syncope (passing out).   Your physician has requested that you have an echocardiogram. Echocardiography is a painless test that uses sound waves to create images of your heart. It provides your doctor with information about the size and shape of your heart and how well your heart's chambers and valves are working. This procedure takes approximately one hour. There are no restrictions for this procedure.  Your physician has requested that you have a carotid duplex. This test is an ultrasound of the carotid arteries in your neck. It looks at blood flow through these arteries that supply the brain with blood. Allow one hour for this exam. There are no restrictions or special instructions.     Your cardiac CT will be scheduled at one of the below locations:   [facility] [address] [place], [place] [address] ([phone] [place] [address] [place], [place] [address] [phone]  [facility] [place], [place] [address] [phone]  If scheduled at [facility], please arrive at the [facility] Entrance (Entrance C2) of [facility] 30 minutes prior to test start time. You can use the FREE valet parking offered at entrance C (encouraged to control the heart rate for the test)  Proceed to the [facility] Radiology Department (first floor) to check-in and test prep.  All radiology patients and guests should use entrance C2 at [facility], accessed from [facility], even though the hospital's physical address listed is [address]    If scheduled at [facility] or [facility], please arrive 15 mins early for check-in and test prep.   Please follow these instructions carefully (unless otherwise directed):  Hold all erectile dysfunction medications at least 3 days (72 hrs) prior to test. (Ie viagra, cialis, sildenafil, tadalafil, etc) We will administer nitroglycerin during this exam.   On the Night Before the Test: Be sure to Drink plenty of water. Do not consume any caffeinated/decaffeinated beverages or chocolate 12 hours prior to your test. Do not take any antihistamines 12 hours prior to your test. If the patient has contrast allergy: Patient will need a prescription for Prednisone and very clear instructions (as follows): Prednisone 50 mg - take 13 hours prior to test Take another Prednisone 50 mg 7 hours prior to test Take another Prednisone 50 mg 1 hour  prior to test Take Benadryl 50 mg 1 hour prior to test Patient must complete all four doses of above prophylactic medications. Patient will need a ride after test due to Benadryl.  On the Day of the Test: Drink plenty of water until 1 hour prior to the test. Do not eat any food 1 hour  prior to test. You may take your regular medications prior to the test.  Take metoprolol (Lopressor) two hours prior to test. HOLD Furosemide/Hydrochlorothiazide morning of the test. FEMALES- please wear underwire-free bra if available, avoid dresses & tight clothing   *For Clinical Staff only. Please instruct patient the following:* Heart Rate Medication Recommendations for Cardiac CT  Resting HR < 50 bpm  No medication  Resting HR 50-60 bpm and BP >110/50 mmHG   Consider Metoprolol tartrate 25 mg PO 90-120 min prior to scan  Resting HR 60-65 bpm and BP >110/50 mmHG  Metoprolol tartrate 50 mg PO 90-120 minutes prior to scan   Resting HR > 65 bpm and BP >110/50 mmHG  Metoprolol tartrate 100 mg PO 90-120 minutes prior to scan  Consider Ivabradine 10-15 mg PO or a calcium channel blocker for resting HR >60 bpm and contraindication to metoprolol tartrate  Consider Ivabradine 10-15 mg PO in combination with metoprolol tartrate for HR >80 bpm         After the Test: Drink plenty of water. After receiving IV contrast, you may experience a mild flushed feeling. This is normal. On occasion, you may experience a mild rash up to 24 hours after the test. This is not dangerous. If this occurs, you can take Benadryl 25 mg and increase your fluid intake. If you experience trouble breathing, this can be serious. If it is severe call 911 IMMEDIATELY. If it is mild, please call our office. If you take any of these medications: Glipizide/Metformin, Avandament, Glucavance, please do not take 48 hours after completing test unless otherwise instructed.  We will call to schedule your test 2-4 weeks out understanding that some insurance companies will need an authorization prior to the service being performed.   For non-scheduling related questions, please contact the cardiac imaging nurse navigator should you have any questions/concerns: Marchia Bond, Cardiac Imaging Nurse Navigator Gordy Clement, Cardiac  Imaging Nurse Navigator [facility] Direct Office Dial: [phone]   For scheduling needs, including cancellations and rescheduling, please call [place], [phone].  Please do NOT wear cologne, perfume, aftershave, or lotions (deodorant is allowed). Please arrive 15 minutes prior to your appointment time.   Follow-Up: At [facility], you and your health needs are our priority.  As part of our continuing mission to provide you with exceptional heart care, we have created designated Provider Care Teams.  These Care Teams include your primary Cardiologist (physician) and Advanced Practice Providers (APPs -  Physician Assistants and Nurse Practitioners) who all work together to provide you with the care you need, when you need it.  We recommend signing up for the patient portal called "MyChart".  Sign up information is provided on this After Visit Summary.  MyChart is used to connect with patients for Virtual Visits (Telemedicine).  Patients are able to view lab/test results, encounter notes, upcoming appointments, etc.  Non-urgent messages can be sent to your provider as well.   To learn more about what you can do with MyChart, go to [email or web address].    Your next appointment:    Keep follow up  The format for your next appointment:  In Person  Provider:   Elouise Munroe, MD     Other Instructions [employer] Cardiac Event Monitor Instructions Your physician has requested you wear your cardiac event monitor for 30 days. [employer] may call or text to confirm a shipping address. The monitor will be sent to a land address via UPS. [employer] will not ship a monitor to a PO BOX. It typically takes 3-5 days to receive your monitor after it has been enrolled. [employer] will assist with USPS tracking if your package is delayed. The telephone number for [employer] is [phone]. Once you have received your monitor, please review the  enclosed instructions. Instruction tutorials can also be viewed under help and settings on the enclosed cell phone. Your monitor has already been registered assigning a specific monitor serial # to you.  Applying the monitor Remove cell phone from case and turn it on. The cell phone works as Dealer and needs to be within [employer] of you at all times. The cell phone will need to be charged on a daily basis. We recommend you plug the cell phone into the enclosed charger at your bedside table every night.  Monitor batteries: You will receive two monitor batteries labelled #1 and #2. These are your recorders. Plug battery #2 onto the second connection on the enclosed charger. Keep one battery on the charger at all times. This will keep the monitor battery deactivated. It will also keep it fully charged for when you need to switch your monitor batteries. A small light will be blinking on the battery emblem when it is charging. The light on the battery emblem will remain on when the battery is fully charged.  Open package of a Monitor strip. Insert battery #1 into black hood on strip and gently squeeze monitor battery onto connection as indicated in instruction booklet. Set aside while preparing skin.  Choose location for your strip, vertical or horizontal, as indicated in the instruction booklet. Shave to remove all hair from location. There cannot be any lotions, oils, powders, or colognes on skin where monitor is to be applied. Wipe skin clean with enclosed Saline wipe. Dry skin completely.  Peel paper labeled #1 off the back of the Monitor strip exposing the adhesive. Place the monitor on the chest in the vertical or horizontal position shown in the instruction booklet. One arrow on the monitor strip must be pointing upward. Carefully remove paper labeled #2, attaching remainder of strip to your skin. Try not to create any folds or wrinkles in the strip as you apply it.  Firmly  press and release the circle in the center of the monitor battery. You will hear a small beep. This is turning the monitor battery on. The heart emblem on the monitor battery will light up every 5 seconds if the monitor battery in turned on and connected to the patient securely. Do not push and hold the circle down as this turns the monitor battery off. The cell phone will locate the monitor battery. A screen will appear on the cell phone checking the connection of your monitor strip. This may read poor connection initially but change to good connection within the next minute. Once your monitor accepts the connection you will hear a series of 3 beeps followed by a climbing crescendo of beeps. A screen will appear on the cell phone showing the two monitor strip placement options. Touch the picture that demonstrates where you applied the monitor strip.  Your monitor strip and battery are waterproof.  You are able to shower, bathe, or swim with the monitor on. They just ask you do not submerge deeper than 3 feet underwater. We recommend removing the monitor if you are swimming in a lake, river, or ocean.  Your monitor battery will need to be switched to a fully charged monitor battery approximately once a week. The cell phone will alert you of an action which needs to be made.  On the cell phone, tap for details to reveal connection status, monitor battery status, and cell phone battery status. The green dots indicates your monitor is in good status. A red dot indicates there is something that needs your attention.  To record a symptom, click the circle on the monitor battery. In 30-60 seconds a list of symptoms will appear on the cell phone. Select your symptom and tap save. Your monitor will record a sustained or significant arrhythmia regardless of you clicking the button. Some patients do not feel the heart rhythm irregularities. [employer] will notify us of any serious or critical  events.  Refer to instruction booklet for instructions on switching batteries, changing strips, the Do not disturb or Pause features, or any additional questions.  Call [employer] at [phone], to confirm your monitor is transmitting and record your baseline. They will answer any questions you may have regarding the monitor instructions at that time.  Returning the monitor to [place] all equipment back into blue box. Peel off strip of paper to expose adhesive and close box securely. There is a prepaid UPS shipping label on this box. Drop in a UPS drop box, or at a UPS facility like [employer]. You may also contact [employer] to arrange UPS to pick up monitor package at your home.   Important Information About [place]

## 2022-02-20 NOTE — Progress Notes (Signed)
Office Visit    Patient Name: Victoria Stokes Date of Encounter: 02/20/2022  Primary Care Provider:  Cari Caraway, MD Primary Cardiologist:  Elouise Munroe, MD  Chief Complaint    67 year old female with a history of pericardial effusion, atrial tachycardia, aortic atherosclerosis, hyperlipidemia, chronic hyponatremia complicated by seizures, lupus, and MGUS who presents for follow-up related to dizziness and presyncope.  Past Medical History    Past Medical History:  Diagnosis Date   Low sodium levels    Hx   Seizures ([place]) 10/29/2016   Past Surgical History:  Procedure Laterality Date   CESAREAN SECTION      Allergies  Allergies  Allergen Reactions   Gluten Meal Other (See Comments)    headache     Labs/Other Studies Reviewed    The following studies were reviewed today: Cardiac monitor 07/14/2019: Indication: tachycardia   Minimum HR (bpm): 60 Maximum HR (bpm): 146   Supraventricular Ectopy: 3% total SVT: none   Ventricular Ectopy: 5% total NSVT: 4 beat run at 150 bpm.  Ventricular Tachycardia: none   Pauses: none AV block: 1st degree AVB   Atrial fibrillation: none   Diary events: none   IMPRESSION: Sinus rhythm with PVCs, no significant atrial arrhythmia or tachycardia.   Echo 05/26/2020: IMPRESSIONS    1. Left ventricular ejection fraction, by estimation, is 60 to 65%. The  left ventricle has normal function. The left ventricle has no regional  wall motion abnormalities. Septal bounce is again present similar to prior  echoes. There is mild concentric  left ventricular hypertrophy. Left ventricular diastolic parameters were  normal. The average left ventricular global longitudinal strain is -20.0  %. The global longitudinal strain is normal.   2. Right ventricular systolic function is normal. The right ventricular  size is normal.   3. The mitral valve is normal in structure. Trivial mitral valve  regurgitation.   4. The aortic valve is  tricuspid. Aortic valve regurgitation is trivial.  No aortic stenosis is present.   5. The inferior vena cava is normal in size with greater than 50%  respiratory variability, suggesting right atrial pressure of 3 mmHg.   Comparison(s): Compared to prior echo in 05/06/2019, there is no longer a  pericardial effusion present.    Recent Labs: No results found for requested labs within last 365 days.  Recent Lipid Panel No results found for: "CHOL", "TRIG", "HDL", "CHOLHDL", "VLDL", "LDLCALC", "LDLDIRECT"  History of Present Illness    67 year old female with the above past medical history including pericardial effusion, atrial tachycardia, aortic atherosclerosis, hyperlipidemia, chronic hyponatremia complicated by seizures, lupus, and MGUS.  She was hospitalized in 2021 in the setting of chest pain, shortness of breath, small to moderate pericardial effusion without tamponade.  Cardiology was consulted.  Follow-up echocardiogram showed resolution of pericardial effusion.  However, annual screening for pulmonary hypertension with echocardiogram was recommended in the setting of lupus.  Cardiac monitor in 07/2019 showed sinus rhythm with PVCs, no significant atrial arrhythmia or tachycardia. Most recent echo in 05/2020 showed EF 60 to 65%, normal LV function, no RWMA, septal bounce, mild concentric LVH, normal RV systolic function, trivial mitral valve regurgitation and trivial aortic valve regurgitation.  She was last seen in the office on 03/12/2020 was stable from a cardiac standpoint.  She contacted our office on 02/19/2022 with concerns for dizziness and presyncope.  She presents today for follow-up accompanied by her husband.  Since her last visit she notes 2 presyncopal episodes that occurred  on 02/04/2022 and 02/16/2022 during which she felt fatigued, lightheaded.  She notes the room was "spinning" and she felt like she was going to pass out.  In both instances she laid down to rest and though her  symptoms lasted for hours, they eventually resolved spontaneously.  With each episode she noted associated palpitations, chest tightness, and mild dyspnea. She reports an increased amount of stress at work and has felt generally fatigued, also thinks she may have had a viral infection in the last month.  Her near syncope was alarming to both her and her husband, prompting today's visit (additionally, she canceled a planned trip to [place] to have a belated Christmas celebration with her family).  Home Medications    Current Outpatient Medications  Medication Sig Dispense Refill   B Complex Vitamins (VITAMIN B-COMPLEX PO) Take 1 tablet by mouth daily.      Cholecalciferol (VITAMIN D PO) Take 5,000 Units by mouth daily.      hydroxychloroquine (PLAQUENIL) 200 MG tablet Take 200 mg by mouth daily.     Melatonin 10 MG TABS Take 10 mg by mouth at bedtime.     metoprolol tartrate (LOPRESSOR) 100 MG tablet Take 1 tab 2 hours prior to procedure 1 tablet 0   sodium chloride 1 g tablet Take 1 tablet (1 g total) by mouth daily. 15 tablet 0   valACYclovir (VALTREX) 500 MG tablet Take 500 mg by mouth 2 (two) times daily as needed. Cold sores     VITAMIN A PO Take 1 tablet by mouth daily.      VITAMIN K PO Take 1 tablet by mouth daily.      zolpidem (AMBIEN) 5 MG tablet Take 5 mg by mouth as needed for sleep.     No current facility-administered medications for this visit.   Facility-Administered Medications Ordered in Other Visits  Medication Dose Route Frequency Provider Last Rate Last Admin   gadopentetate dimeglumine (MAGNEVIST) injection 13 mL  13 mL Intravenous Once PRN Kathrynn Ducking, MD         Review of Systems    She denies pnd, orthopnea, n, v, syncope, edema, weight gain, or early satiety. All other systems reviewed and are otherwise negative except as noted above.   Physical Exam    VS:  BP 132/72 (BP Location: Left Arm, Patient Position: Sitting, Cuff Size: Normal)   Pulse 70    Ht '5\' 7"'$  (1.702 m)   Wt 148 lb 6.4 oz (67.3 kg)   SpO2 99%   BMI 23.24 kg/m  GEN: Well nourished, well developed, in no acute distress. HEENT: normal. Neck: Supple, no JVD, carotid bruits, or masses. Cardiac: RRR, no murmurs, rubs, or gallops. No clubbing, cyanosis, edema.  Radials/DP/PT 2+ and equal bilaterally.  Respiratory:  Respirations regular and unlabored, clear to auscultation bilaterally. GI: Soft, nontender, nondistended, BS + x 4. MS: no deformity or atrophy. Skin: warm and dry, no rash. Neuro:  Strength and sensation are intact. Psych: Normal affect.  Accessory Clinical Findings    ECG personally reviewed by me today - NSR, 70 bpm - no acute changes.   Lab Results  Component Value Date   WBC 3.9 (L) 05/11/2019   HGB 10.5 (L) 05/11/2019   HCT 31.4 (L) 05/11/2019   MCV 88.0 05/11/2019   PLT 485 (H) 05/11/2019   Lab Results  Component Value Date   CREATININE 0.64 05/11/2019   BUN 8 05/11/2019   NA 133 (L) 05/11/2019   K  3.6 05/11/2019   CL 97 (L) 05/11/2019   CO2 25 05/11/2019   Lab Results  Component Value Date   ALT 18 04/30/2019   AST 27 04/30/2019   ALKPHOS 59 04/30/2019   BILITOT 1.0 04/30/2019   No results found for: "CHOL", "HDL", "LDLCALC", "LDLDIRECT", "TRIG", "CHOLHDL"  Lab Results  Component Value Date   HGBA1C 5.5 05/06/2019    Assessment & Plan    1. Dizziness/presyncope/palpitations/chest tightness: She notes 2 episodes in 2 weeks of dizziness, fatigue, lightheadedness where she felt like the room was "spinning."  She was unsteady and felt as if she were going to pass out.  She laid down to rest and her symptoms resolved.  With each episode she noted associated palpitations, chest tightness, and mild dyspnea.  Denies any exertional symptoms outside of these 2 episodes. Some of her symptoms could represent vertigo.  She also report a viral illness approximately 1 month ago. Additionally, she notes significant stress at work.   Echo in 05/2020  showed EF 60 to 65%, normal LV function, no RWMA, septal bounce, mild concentric LVH, normal RV systolic function, trivial mitral valve regurgitation and trivial aortic valve regurgitation.  BMET and CBC on 02/15/2022 were stable.   Need to rule out cardiac etiology. Discussed ED precautions. Will repeat echo, will check carotid Dopplers, 30-day event monitor, and coronary CTA.   2. History of pericardial effusion: Will repeat echo as above.   3. Atrial tachycardia: Prior monitor in 07/2019 showed sinus rhythm with PVCs, no significant atrial arrhythmia or tachycardia.  Does note recent palpitations as above (see #1).  Will repeat 30-day event monitor rule out arrhythmogenic source of dizziness/presyncope/palpitations.  4. Aortic atherosclerosis: Noted on CT in 2021.  Coronary CTA pending as above.  Discussed possible need for statin therapy in the future.  For now, contiue lifestyle modifications with diet and exercise.   5. Hyperlipidemia: Not on statin.  No recent LDL on file.  Consider repeat fasting lipid panel at next follow-up visit.  6. Hyponatremia: Chronic with h/o seizures. Continue supplementation.   7. SLE: She questions whether or not her recent symptoms could be related to a lupus flare.  Will rule out cardiac source.  Follows with rheumatology.   8. Disposition: Follow-up as scheduled with Dr. Margaretann Loveless in 03/2022.      Lenna Sciara, NP 02/20/2022, 12:50 PM

## 2022-02-21 ENCOUNTER — Ambulatory Visit (HOSPITAL_COMMUNITY)
Admission: RE | Admit: 2022-02-21 | Discharge: 2022-02-21 | Disposition: A | Discharge status: Home / Self Care | Payer: 59 | Source: Ambulatory Visit | Attending: Nurse Practitioner | Admitting: Nurse Practitioner

## 2022-02-21 DIAGNOSIS — R42 Dizziness and giddiness: Secondary | ICD-10-CM | POA: Diagnosis present

## 2022-02-21 DIAGNOSIS — R55 Syncope and collapse: Secondary | ICD-10-CM | POA: Diagnosis not present

## 2022-02-21 NOTE — Telephone Encounter (Signed)
Patient seen in office yesterday with Diona Browner, NP yesterday 1/09

## 2022-02-22 ENCOUNTER — Telehealth: Payer: Self-pay

## 2022-02-22 NOTE — Telephone Encounter (Signed)
Spoke with pt. Pt was notified of echo results and recommendations. Pt will continue her current medication and follow up as planned.

## 2022-02-27 ENCOUNTER — Telehealth (HOSPITAL_COMMUNITY): Payer: Self-pay | Admitting: *Deleted

## 2022-02-27 NOTE — Telephone Encounter (Signed)
Reaching out to patient to offer assistance regarding upcoming cardiac imaging study; pt verbalizes understanding of appt date/time, parking situation and where to check in, pre-test NPO status and medications ordered, and verified current allergies; name and call back number provided for further questions should they arise  Leanard Dimaio RN Navigator Cardiac Imaging [facility] [phone] office [phone] cell  Patient to take 100mg metoprolol tartrate two hours prior to her cardiac CT scan. She is aware to arrive at 8:30am. 

## 2022-03-01 ENCOUNTER — Ambulatory Visit (HOSPITAL_COMMUNITY)
Admission: RE | Admit: 2022-03-01 | Discharge: 2022-03-01 | Disposition: A | Discharge status: Home / Self Care | Payer: 59 | Source: Ambulatory Visit | Attending: Nurse Practitioner | Admitting: Nurse Practitioner

## 2022-03-01 DIAGNOSIS — R55 Syncope and collapse: Secondary | ICD-10-CM | POA: Diagnosis present

## 2022-03-01 DIAGNOSIS — R072 Precordial pain: Secondary | ICD-10-CM | POA: Insufficient documentation

## 2022-03-01 DIAGNOSIS — R42 Dizziness and giddiness: Secondary | ICD-10-CM | POA: Diagnosis not present

## 2022-03-01 MED ORDER — IOHEXOL 350 MG/ML SOLN
100.0000 mL | Freq: Once | INTRAVENOUS | Status: AC | PRN
  Administered 2022-03-01: 100 mL via INTRAVENOUS

## 2022-03-01 MED ORDER — NITROGLYCERIN 0.4 MG SL SUBL: 0.4000 mg | SUBLINGUAL_TABLET | SUBLINGUAL | Status: DC | PRN

## 2022-03-01 MED ORDER — NITROGLYCERIN 0.4 MG SL SUBL
SUBLINGUAL_TABLET | SUBLINGUAL | Status: AC
  Administered 2022-03-01: 0.8 mg
  Filled 2022-03-01: qty 2

## 2022-03-07 ENCOUNTER — Telehealth: Payer: Self-pay

## 2022-03-07 NOTE — Telephone Encounter (Signed)
Spoke with pt. Pt was notified of CT results. Pt will continue her current medication and follow up as planned.

## 2022-04-02 ENCOUNTER — Ambulatory Visit: Payer: BLUE CROSS/BLUE SHIELD | Admitting: Neurology

## 2022-04-05 ENCOUNTER — Encounter: Payer: Self-pay | Admitting: Internal Medicine

## 2022-04-05 ENCOUNTER — Ambulatory Visit: Payer: 59 | Attending: Internal Medicine | Admitting: Internal Medicine

## 2022-04-05 VITALS — BP 128/72 | HR 70 | Ht 67.0 in | Wt 156.0 lb

## 2022-04-05 DIAGNOSIS — R072 Precordial pain: Secondary | ICD-10-CM

## 2022-04-05 DIAGNOSIS — I3139 Other pericardial effusion (noninflammatory): Secondary | ICD-10-CM

## 2022-04-05 DIAGNOSIS — E785 Hyperlipidemia, unspecified: Secondary | ICD-10-CM

## 2022-04-05 DIAGNOSIS — I700 Atherosclerosis of aorta: Secondary | ICD-10-CM

## 2022-04-05 DIAGNOSIS — I318 Other specified diseases of pericardium: Secondary | ICD-10-CM

## 2022-04-05 DIAGNOSIS — I4719 Other supraventricular tachycardia: Secondary | ICD-10-CM

## 2022-04-05 DIAGNOSIS — R55 Syncope and collapse: Secondary | ICD-10-CM

## 2022-04-05 DIAGNOSIS — R002 Palpitations: Secondary | ICD-10-CM

## 2022-04-05 DIAGNOSIS — R42 Dizziness and giddiness: Secondary | ICD-10-CM | POA: Diagnosis not present

## 2022-04-05 DIAGNOSIS — E871 Hypo-osmolality and hyponatremia: Secondary | ICD-10-CM

## 2022-04-05 DIAGNOSIS — M359 Systemic involvement of connective tissue, unspecified: Secondary | ICD-10-CM

## 2022-04-05 DIAGNOSIS — M329 Systemic lupus erythematosus, unspecified: Secondary | ICD-10-CM

## 2022-04-05 NOTE — Patient Instructions (Signed)
Medication Instructions:  No Changes In Medications at this time.  *If you need a refill on your cardiac medications before your next appointment, please call your pharmacy*  Follow-Up: At [facility], you and your health needs are our priority.  As part of our continuing mission to provide you with exceptional heart care, we have created designated Provider Care Teams.  These Care Teams include your primary Cardiologist (physician) and Advanced Practice Providers (APPs -  Physician Assistants and Nurse Practitioners) who all work together to provide you with the care you need, when you need it.  Your next appointment:   1 year(s)  Provider:   Elouise Munroe, MD     Mediterranean Diet A Mediterranean diet refers to food and lifestyle choices that are based on the traditions of countries located on the [place]. It focuses on eating more fruits, vegetables, whole grains, beans, nuts, seeds, and heart-healthy fats, and eating less dairy, meat, eggs, and processed foods with added sugar, salt, and fat. This way of eating has been shown to help prevent certain conditions and improve outcomes for people who have chronic diseases, like kidney disease and heart disease. What are tips for following this plan? Reading food labels Check the serving size of packaged foods. For foods such as rice and pasta, the serving size refers to the amount of cooked product, not dry. Check the total fat in packaged foods. Avoid foods that have saturated fat or trans fats. Check the ingredient list for added sugars, such as corn syrup. Shopping  Buy a variety of foods that offer a balanced diet, including: Fresh fruits and vegetables (produce). Grains, beans, nuts, and seeds. Some of these may be available in unpackaged forms or large amounts (in bulk). Fresh seafood. Poultry and eggs. Low-fat dairy products. Buy whole ingredients instead of prepackaged foods. Buy fresh fruits and  vegetables in-season from local farmers markets. Buy plain frozen fruits and vegetables. If you do not have access to quality fresh seafood, buy precooked frozen shrimp or canned fish, such as tuna, salmon, or sardines. Stock your pantry so you always have certain foods on hand, such as olive oil, canned tuna, canned tomatoes, rice, pasta, and beans. Cooking Cook foods with extra-virgin olive oil instead of using butter or other vegetable oils. Have meat as a side dish, and have vegetables or grains as your main dish. This means having meat in small portions or adding small amounts of meat to foods like pasta or stew. Use beans or vegetables instead of meat in common dishes like chili or lasagna. Experiment with different cooking methods. Try roasting, broiling, steaming, and sauting vegetables. Add frozen vegetables to soups, stews, pasta, or rice. Add nuts or seeds for added healthy fats and plant protein at each meal. You can add these to yogurt, salads, or vegetable dishes. Marinate fish or vegetables using olive oil, lemon juice, garlic, and fresh herbs. Meal planning Plan to eat one vegetarian meal one day each week. Try to work up to two vegetarian meals, if possible. Eat seafood two or more times a week. Have healthy snacks readily available, such as: Vegetable sticks with hummus. Greek yogurt. Fruit and nut trail mix. Eat balanced meals throughout the week. This includes: Fruit: 2-3 servings a day. Vegetables: 4-5 servings a day. Low-fat dairy: 2 servings a day. Fish, poultry, or lean meat: 1 serving a day. Beans and legumes: 2 or more servings a week. Nuts and seeds: 1-2 servings a day. Whole grains: 6-8  servings a day. Extra-virgin olive oil: 3-4 servings a day. Limit red meat and sweets to only a few servings a month. Lifestyle  Cook and eat meals together with your family, when possible. Drink enough fluid to keep your urine pale yellow. Be physically active every day.  This includes: Aerobic exercise like running or swimming. Leisure activities like gardening, walking, or housework. Get 7-8 hours of sleep each night. If recommended by your health care provider, drink red wine in moderation. This means 1 glass a day for nonpregnant women and 2 glasses a day for men. A glass of wine equals 5 oz (150 mL). What foods should I eat? Fruits Apples. Apricots. Avocado. Berries. Bananas. Cherries. Dates. Figs. Grapes. Lemons. Melon. Oranges. Peaches. Plums. Pomegranate. Vegetables Artichokes. Beets. Broccoli. Cabbage. Carrots. Eggplant. Green beans. Chard. Kale. Spinach. Onions. Leeks. Peas. Squash. Tomatoes. Peppers. Radishes. Grains Whole-grain pasta. Brown rice. Bulgur wheat. Polenta. Couscous. Whole-wheat bread. [place]. Meats and other proteins Beans. Almonds. Sunflower seeds. Pine nuts. Peanuts. [place]. Salmon. Scallops. Shrimp. [place]. Tilapia. Clams. Oysters. Eggs. Poultry without skin. Dairy Low-fat milk. Cheese. Greek yogurt. Fats and oils Extra-virgin olive oil. Avocado oil. Grapeseed oil. Beverages Water. Red wine. Herbal tea. Sweets and desserts Greek yogurt with honey. Baked apples. Poached pears. Trail mix. Seasonings and condiments Basil. Cilantro. Coriander. Cumin. Mint. Parsley. Sage. Rosemary. Tarragon. Garlic. Oregano. Thyme. Pepper. Balsamic vinegar. Tahini. Hummus. Tomato sauce. Olives. Mushrooms. The items listed above may not be a complete list of foods and beverages you can eat. Contact a dietitian for more information. What foods should I limit? This is a list of foods that should be eaten rarely or only on special occasions. Fruits Fruit canned in syrup. Vegetables Deep-fried potatoes (french fries). Grains Prepackaged pasta or rice dishes. Prepackaged cereal with added sugar. Prepackaged snacks with added sugar. Meats and other proteins Beef. Pork. Lamb. Poultry with skin. Hot dogs. [place] Salines. Dairy Ice cream. Sour cream. Whole  milk. Fats and oils Butter. Canola oil. Vegetable oil. Beef fat (tallow). Lard. Beverages Juice. Sugar-sweetened soft drinks. Beer. Liquor and spirits. Sweets and desserts Cookies. Cakes. Pies. Candy. Seasonings and condiments Mayonnaise. Pre-made sauces and marinades. The items listed above may not be a complete list of foods and beverages you should limit. Contact a dietitian for more information. Summary The Mediterranean diet includes both food and lifestyle choices. Eat a variety of fresh fruits and vegetables, beans, nuts, seeds, and whole grains. Limit the amount of red meat and sweets that you eat. If recommended by your health care provider, drink red wine in moderation. This means 1 glass a day for nonpregnant women and 2 glasses a day for men. A glass of wine equals 5 oz (150 mL). This information is not intended to replace advice given to you by your health care provider. Make sure you discuss any questions you have with your health care provider. Document Revised: 03/06/2019 Document Reviewed: 01/01/2019 Elsevier Patient Education  [place].

## 2022-04-05 NOTE — Progress Notes (Signed)
Cardiology Office Note:    Date:  04/05/2022   ID:  Victoria Stokes, DOB 05-12-1955, MRN [id number]  PCP:  Cari Caraway, MD  Cardiologist:  Elouise Munroe, MD  Electrophysiologist:  None   Referring MD: Cari Caraway, MD   Chief Complaint/Reason for Referral: Pericardial effusion, lupus  History of Present Illness:    Victoria Stokes is a 67 y.o. female with a history of chronic hyponatremia complicated by seizures, MGUS followed at [facility], who I initially met in the hospital with a presentation of chest pain and increasing shortness of breath with low-grade fever for 2 to 3 weeks.  While in hospital she was noted to have a small to moderate pericardial effusion without tamponade physiology, and repeat echocardiogram in the office in April demonstrates near resolution of pericardial effusion. Repeat echo shows no pericardial effusion.  Today she is accompanied by her husband. After having the vertigo she wanted to have a full check up to make sure there were any other potential issues. She had 3 episodes on 12/24 1/4 and 2/12. The episodes felt like the room was spinning and had some chest discomfort and possible palpitations. She is not sure if they were real palpitations. She noted that on those days she had eaten a lot of gluten those days. She does have a history of some gluten sensitivity, it had caused her to have bad headaches in the past. She does acknowledge that she did have a viral infection around the holidays as well as some issues with her sinuses.  We discussed her recent test results. The cardiac monitor was never started but given the results of her other test she was not very concerned. Joint decision was made to hold off on the monitor as long as symptoms to not worsen. There was some confusion about where some trace calcium was found in the coronary calcium CT. Confirmed that it was the aortic valve that had trivial calcium there and there was small amounts in the  aorta. The amounts were small enough that the score was trivial.   Cholesterol panel has not been updated since 2022, need to update to ensure minimal risk given the small calcium levels in aortic valve and aorta. May be able to manage with diet unless cholesterol levels are high, then may want to add medication.   Last follow up with Dr. Christel Mormon was last month and she was stable.   She denies any shortness of breath, or peripheral edema. No lightheadedness, headaches, syncope, orthopnea, or PND.    Past Medical History:  Diagnosis Date   Low sodium levels    Hx   Seizures ([place]) 10/29/2016    Past Surgical History:  Procedure Laterality Date   CESAREAN SECTION      Current Medications: Current Meds  Medication Sig   B Complex Vitamins (VITAMIN B-COMPLEX PO) Take 1 tablet by mouth daily.    Cholecalciferol (VITAMIN D PO) Take 5,000 Units by mouth daily.    hydroxychloroquine (PLAQUENIL) 200 MG tablet Take 200 mg by mouth daily.   Melatonin 10 MG TABS Take 10 mg by mouth at bedtime.   sodium chloride 1 g tablet Take 1 tablet (1 g total) by mouth daily.   valACYclovir (VALTREX) 500 MG tablet Take 500 mg by mouth 2 (two) times daily as needed. Cold sores   VITAMIN K PO Take 1 tablet by mouth daily.      Allergies:   Gluten meal   Social History  Tobacco Use   Smoking status: Never   Smokeless tobacco: Never  Vaping Use   Vaping Use: Never used  Substance Use Topics   Alcohol use: Yes    Alcohol/week: 0.0 standard drinks of alcohol    Comment: 2 drinks per month   Drug use: No     Family History: The patient's family history includes Breast cancer in her mother; Pancreatic cancer in her father. There is no history of Seizures.  ROS:   Please see the history of present illness. (+) vertigo (episodes)    (+) chest discomfort (associated with vertigo episode) (+) palpitations (associated with vertigo episode) All other systems reviewed and are  negative.  EKGs/Labs/Other Studies Reviewed:    The following studies were reviewed today:  Coronary Calcium Score 03/01/2022: IMPRESSION: 1. No evidence of CAD, 0% stenosis, CADRADS 0.   2. Coronary calcium score of 0.   3. Normal coronary origins with right dominance.   RECOMMENDATIONS: CAD-RADS 0. No evidence of CAD (0%). Consider non-atherosclerotic causes of chest pain.  Bilateral Carotid Doppler 02/24/2022: Summary:  Right Carotid: The extracranial vessels were near-normal with only minimal  wall thickening or plaque.   Left Carotid: The extracranial vessels were near-normal with only minimal  wall thickening or plaque.   Vertebrals:  Bilateral vertebral arteries demonstrate antegrade flow.  Subclavians: Normal flow hemodynamics were seen in bilateral subclavian arteries.   ECHO 02/20/2022: IMPRESSIONS    1. Left ventricular ejection fraction, by estimation, is 55 to 60%. Left  ventricular ejection fraction by 3D volume is 55 %. The left ventricle has  normal function. The left ventricle has no regional wall motion  abnormalities. Left ventricular diastolic   parameters are consistent with Grade I diastolic dysfunction (impaired  relaxation). The average left ventricular global longitudinal strain is  -25.9 %. The global longitudinal strain is normal.   2. Right ventricular systolic function is normal. The right ventricular  size is normal.   3. The mitral valve is normal in structure. No evidence of mitral valve  regurgitation. No evidence of mitral stenosis.   4. The aortic valve is tricuspid. Aortic valve regurgitation is not  visualized. No aortic stenosis is present.   5. The inferior vena cava is normal in size with greater than 50%  respiratory variability, suggesting right atrial pressure of 3 mmHg.    EKG: EKG is personally reviewed.  04/05/2022: EKG was not ordered.  06/10/2020: Sinus bradycardia with first-degree AV block, rate 59, PR interval 216 ms.   Compared to the prior ECG 11/26/2019, heart rate is slower.  I have independently reviewed the images from CT angio chest PE 04/30/2019.  I have reviewed these images with the patient.  Mild aortic atherosclerosis, no definite coronary artery calcifications.  Small pericardial effusion.  Recent Labs: No results found for requested labs within last 365 days.  Recent Lipid Panel No results found for: "CHOL", "TRIG", "HDL", "CHOLHDL", "VLDL", "LDLCALC", "LDLDIRECT"  Physical Exam:    VS:  BP 128/72   Pulse 70   Ht 5\' 7"  (1.702 m)   Wt 156 lb (70.8 kg)   SpO2 98%   BMI 24.43 kg/m     Wt Readings from Last 5 Encounters:  04/09/22 156 lb (70.8 kg)  04/05/22 156 lb (70.8 kg)  02/20/22 148 lb 6.4 oz (67.3 kg)  06/10/20 147 lb (66.7 kg)  11/26/19 138 lb 9.6 oz (62.9 kg)    Constitutional: No acute distress Eyes: sclera non-icteric, normal conjunctiva and lids ENMT:  normal dentition, moist mucous membranes Cardiovascular: regular rhythm, normal rate, no murmurs. S1 and S2 normal. Radial pulses normal bilaterally. No jugular venous distention.  Respiratory: clear to auscultation bilaterally GI : normal bowel sounds, soft and nontender. No distention.   MSK: extremities warm, well perfused. No edema.  NEURO: grossly nonfocal exam, moves all extremities. PSYCH: alert and oriented x 3, normal mood and affect.   ASSESSMENT:    1. Dizziness   2. Precordial pain   3. Pre-syncope   4. Pericardial effusion   5. Atrial tachycardia   6. Palpitations   7. Aortic atherosclerosis ([place])   8. Hyperlipidemia, unspecified hyperlipidemia type   9. Hyponatremia   10. Lupus ([place])   11. Other pericarditis, unspecified chronicity   12. Connective tissue disorder (HCC)    PLAN:    Pericardial effusion - Plan: EKG 12-Lead Other pericarditis, unspecified chronicity -No recurrent chest pain. Most recent echo reviewed (02/20/22),no effusion. - Mild septal shudder on echo with no other features of  constriction, we will follow this clinically.   Lupus (HCC)-stable per patient, continues on Plaquenil. Connective tissue disorder (HCC)  Atrial tachycardia (HCC)-no palpitations, overall feeling well. A few episodes of vertigo, since she is feeling well now monitor has been deferred.   Hyponatremia-she states this is not related to excessive volume intake. Followed by PCP.  We discussed that from a cardiac perspective, it is safe to continue salt supplementation and monitor symptoms.  No features of diastolic/constrictive heart failure.  Aortic atherosclerosis HLD -this is mild and present on her CT from March 2021.  We discussed indications for statin therapy.  This can be observed, however would recommend diet and lifestyle modification for management of elevated LDL of 118.  If no improvement in lipids with a goal LDL of less than 70, would consider statin therapy for these indications. Updated cholesterol levels needed. Monitor diet to get LDL less than 70  (last was 118 from 2022).   Total time of encounter: 30 minutes total time of encounter, including 20 minutes spent in face-to-face patient care on the date of this encounter. This time includes coordination of care and counseling regarding above mentioned problem list. Remainder of non-face-to-face time involved reviewing chart documents/testing relevant to the patient encounter and documentation in the medical record. I have independently reviewed documentation from referring provider.   Cherlynn Kaiser, MD, [place],acting as a scribe for Elouise Munroe, MD.,have documented all relevant documentation on the behalf of Elouise Munroe, MD,as directed by  Elouise Munroe, MD while in the presence of Elouise Munroe, MD.   I, Elouise Munroe, MD, have reviewed all documentation for the visit on 04/05/2022. The documentation on today's date of service for the exam, diagnosis,  procedures, and orders are all accurate and complete.

## 2022-04-09 ENCOUNTER — Encounter: Payer: Self-pay | Admitting: Neurology

## 2022-04-09 ENCOUNTER — Ambulatory Visit: Payer: 59 | Admitting: Neurology

## 2022-04-09 VITALS — BP 133/83 | HR 62 | Ht 67.0 in | Wt 156.0 lb

## 2022-04-09 DIAGNOSIS — Z87898 Personal history of other specified conditions: Secondary | ICD-10-CM | POA: Diagnosis not present

## 2022-04-09 DIAGNOSIS — R42 Dizziness and giddiness: Secondary | ICD-10-CM | POA: Diagnosis not present

## 2022-04-09 DIAGNOSIS — D472 Monoclonal gammopathy: Secondary | ICD-10-CM

## 2022-04-09 DIAGNOSIS — E871 Hypo-osmolality and hyponatremia: Secondary | ICD-10-CM | POA: Diagnosis not present

## 2022-04-09 NOTE — Progress Notes (Signed)
[facility]  PATIENT: Victoria Stokes DOB: 07-06-1955  REQUESTING CLINICIAN: Jodene Nam, MD HISTORY FROM: Patient  REASON FOR VISIT: Episodic dizziness    HISTORICAL  CHIEF COMPLAINT:  Chief Complaint  Patient presents with   New Patient (Initial Visit)    Rm 12, with husband  NX Willis/Paper referral for Nonspecific episodes of dizziness Reports 3 events, last episode Mar 25, 2022 Lying BP- 133/83 P-62 Standing BP- 156 93 P-76     HISTORY OF PRESENT ILLNESS:  This is a 67 year old woman past medical history of MGUS, chronic hyponatremia, history of symptomatic seizures following a concussion who is presenting with dizzy spells.  Patient reports since December 24 she had a total of 3 episodes of dizziness/vertigo.  The first episode happened on December 24, second one January 5 and the last one on February 11.  For each of these episodes she reports feeling a sensation of anxiety and not feeling well followed by dizziness that she describes as room spinning sensation.  During this time she will lay in bed with her eyes closed and wait for the episode to pass.  Episode can last up to 2 hours.  She denies any nausea or vomiting associated with the dizziness, no headaches, no falls, no other symptoms.  She reports when she opens her eyes she feels like the room is spinning.  She denies any history of hearing loss, she has no history of tinnitus and no other complaints    OTHER MEDICAL CONDITIONS: MGUS, history of symptomatic seizures    REVIEW OF SYSTEMS: Full 14 system review of systems performed and negative with exception of: As noted in the HPI  ALLERGIES: Allergies  Allergen Reactions   Gluten Meal Other (See Comments)    headache    HOME MEDICATIONS: Outpatient Medications Prior to Visit  Medication Sig Dispense Refill   B Complex Vitamins (VITAMIN B-COMPLEX PO) Take 1 tablet by mouth daily.      Cholecalciferol (VITAMIN D PO) Take 5,000 Units by  mouth daily.      hydroxychloroquine (PLAQUENIL) 200 MG tablet Take 200 mg by mouth daily.     Melatonin 10 MG TABS Take 10 mg by mouth at bedtime.     sodium chloride 1 g tablet Take 1 tablet (1 g total) by mouth daily. 15 tablet 0   valACYclovir (VALTREX) 500 MG tablet Take 500 mg by mouth 2 (two) times daily as needed. Cold sores     VITAMIN K PO Take 1 tablet by mouth daily.      Facility-Administered Medications Prior to Visit  Medication Dose Route Frequency Provider Last Rate Last Admin   gadopentetate dimeglumine (MAGNEVIST) injection 13 mL  13 mL Intravenous Once PRN Kathrynn Ducking, MD        PAST MEDICAL HISTORY: Past Medical History:  Diagnosis Date   Low sodium levels    Hx   Seizures ([place]) 10/29/2016    PAST SURGICAL HISTORY: Past Surgical History:  Procedure Laterality Date   CESAREAN SECTION      FAMILY HISTORY: Family History  Problem Relation Age of Onset   Breast cancer Mother    Pancreatic cancer Father    Seizures Neg Hx     SOCIAL HISTORY: Social History   Socioeconomic History   Marital status: Married    Spouse name: Collier Salina   Number of children: 2   Years of education: 21   Highest education level: Not on file  Occupational History  Occupation: Chief Executive Officer  Tobacco Use   Smoking status: Never   Smokeless tobacco: Never  Vaping Use   Vaping Use: Never used  Substance and Sexual Activity   Alcohol use: Yes    Alcohol/week: 0.0 standard drinks of alcohol    Comment: 2 drinks per month   Drug use: No   Sexual activity: Not on file  Other Topics Concern   Not on file  Social History Narrative   Lives   Caffeine use: 2 cups per day (coffee)   Right handed    Social Determinants of Health   Financial Resource Strain: Not on file  Food Insecurity: Not on file  Transportation Needs: Not on file  Physical Activity: Not on file  Stress: Not on file  Social Connections: Not on file  Intimate Partner Violence: Not on file    PHYSICAL  EXAM  GENERAL EXAM/CONSTITUTIONAL: Vitals:  Vitals:   04/09/22 0841  BP: 133/83  Pulse: 62  Weight: 156 lb (70.8 kg)  Height: '5\' 7"'$  (1.702 m)   Body mass index is 24.43 kg/m. Wt Readings from Last 3 Encounters:  04/09/22 156 lb (70.8 kg)  04/05/22 156 lb (70.8 kg)  02/20/22 148 lb 6.4 oz (67.3 kg)   Patient is in no distress; well developed, nourished and groomed; neck is supple  EYES: Visual fields full to confrontation, Extraocular movements intacts,   MUSCULOSKELETAL: Gait, strength, tone, movements noted in Neurologic exam below  NEUROLOGIC: MENTAL STATUS:      No data to display         awake, alert, oriented to person, place and time recent and remote memory intact normal attention and concentration language fluent, comprehension intact, naming intact fund of knowledge appropriate  CRANIAL NERVE:  2nd, 3rd, 4th, 6th - Visual fields full to confrontation, extraocular muscles intact, no nystagmus 5th - facial sensation symmetric 7th - facial strength symmetric 8th - hearing intact 9th - palate elevates symmetrically, uvula midline 11th - shoulder shrug symmetric 12th - tongue protrusion midline  MOTOR:  normal bulk and tone, full strength in the BUE, BLE  SENSORY:  normal and symmetric to light touch  COORDINATION:  finger-nose-finger, fine finger movements normal  REFLEXES:  deep tendon reflexes present and symmetric  GAIT/STATION:  normal   DIAGNOSTIC DATA (LABS, IMAGING, TESTING) - I reviewed patient records, labs, notes, testing and imaging myself where available.  Lab Results  Component Value Date   WBC 3.9 (L) 05/11/2019   HGB 10.5 (L) 05/11/2019   HCT 31.4 (L) 05/11/2019   MCV 88.0 05/11/2019   PLT 485 (H) 05/11/2019      Component Value Date/Time   NA 133 (L) 05/11/2019 0518   NA 134 (L) 11/20/2016 1428   K 3.6 05/11/2019 0518   K 3.9 11/20/2016 1428   CL 97 (L) 05/11/2019 0518   CO2 25 05/11/2019 0518   CO2 27 11/20/2016  1428   GLUCOSE 87 05/11/2019 0518   GLUCOSE 97 11/20/2016 1428   BUN 8 05/11/2019 0518   BUN 15.3 11/20/2016 1428   CREATININE 0.64 05/11/2019 0518   CREATININE 0.9 11/20/2016 1428   CALCIUM 8.7 (L) 05/11/2019 0518   CALCIUM 9.9 11/20/2016 1428   PROT 7.0 04/30/2019 1231   PROT 8.5 (H) 11/20/2016 1428   PROT 7.9 11/20/2016 1428   ALBUMIN 3.3 (L) 04/30/2019 1231   ALBUMIN 4.6 11/20/2016 1428   AST 27 04/30/2019 1231   AST 23 11/20/2016 1428   ALT 18 04/30/2019 1231  ALT 25 11/20/2016 1428   ALKPHOS 59 04/30/2019 1231   ALKPHOS 78 11/20/2016 1428   BILITOT 1.0 04/30/2019 1231   BILITOT 0.57 11/20/2016 1428   GFRNONAA >60 05/11/2019 0518   GFRAA >60 05/11/2019 0518   No results found for: "CHOL", "HDL", "LDLCALC", "LDLDIRECT", "TRIG", "CHOLHDL" Lab Results  Component Value Date   HGBA1C 5.5 05/06/2019   No results found for: "VITAMINB12" Lab Results  Component Value Date   TSH 1.312 04/30/2019    MRI Brain 2018 1. Few scattered subcortical foci of non-specific gliosis.  2. No acute findings. No abnormal lesions are seen on post contrast views.   3. Several small foci of lytic lesions in the calvarium may be related to multiple myeloma, and likely stable from prior CT and bone survey.     ASSESSMENT AND PLAN  67 y.o. year old female with history of MGUS, history of symptomatic seizure, chronic hyponatremia who is presenting with 3 episodes of vertigo that she describes as room spinning sensation lasting up to 2 hours.  There were no associated symptoms, no nausea, no vomiting, no headaches and na falls. She denies hearing loss and tinnitus. Inform patient that these events can be vertigo due to inner ear dysfunction, this can be triggered by low sodium, dehydration and low intravascular volume.  My recommendation will be to increase her fluid intake, and to make sure that she takes a salt tab daily.  Due to her history of lytic lesion on the MRI, we will repeat the MRI  brain. I will contact her to go over the result return sooner if worse    1. MGUS (monoclonal gammopathy of unknown significance)   2. Vertigo   3. History of seizure   4. Chronic hyponatremia      Patient Instructions  Increase water intake Continue salt tabs as directed MRI brain with and without contrast Follow-up as needed  Orders Placed This Encounter  Procedures   MR BRAIN W WO CONTRAST    No orders of the defined types were placed in this encounter.   Return if symptoms worsen or fail to improve.  I have spent a total of 45 minutes dedicated to this patient today, preparing to see patient, performing a medically appropriate examination and evaluation, ordering tests and/or medications and procedures, and counseling and educating the patient/family/caregiver; independently interpreting result and communicating results to the family/patient/caregiver; and documenting clinical information in the electronic medical record.   Alric Ran, MD 04/09/2022, 9:29 AM  [facility] [address] [place], [place] [address] [phone]

## 2022-04-09 NOTE — Patient Instructions (Signed)
Increase water intake Continue salt tabs as directed MRI brain with and without contrast Follow-up as needed

## 2022-04-17 ENCOUNTER — Telehealth: Payer: Self-pay | Admitting: Neurology

## 2022-04-17 NOTE — Telephone Encounter (Signed)
Pt called stated she is calling to schedule MRI

## 2022-04-18 NOTE — Telephone Encounter (Signed)
I called her back. She is scheduled at [facility] 4/9 at 7:30am

## 2022-05-18 ENCOUNTER — Telehealth: Payer: Self-pay | Admitting: Neurology

## 2022-05-18 NOTE — Telephone Encounter (Signed)
Pt asked that her MRI be cancelled for now

## 2022-05-22 ENCOUNTER — Other Ambulatory Visit: Payer: 59

## 2022-05-25 ENCOUNTER — Other Ambulatory Visit: Payer: Self-pay | Admitting: Internal Medicine

## 2022-05-25 DIAGNOSIS — R7989 Other specified abnormal findings of blood chemistry: Secondary | ICD-10-CM

## 2022-06-06 ENCOUNTER — Encounter: Payer: Self-pay | Admitting: Internal Medicine

## 2022-06-25 ENCOUNTER — Ambulatory Visit
Admission: RE | Admit: 2022-06-25 | Discharge: 2022-06-25 | Disposition: A | Discharge status: Home / Self Care | Payer: 59 | Source: Ambulatory Visit | Attending: Internal Medicine | Admitting: Internal Medicine

## 2022-06-25 DIAGNOSIS — R7989 Other specified abnormal findings of blood chemistry: Secondary | ICD-10-CM

## 2023-05-22 ENCOUNTER — Ambulatory Visit: Payer: 59 | Admitting: Internal Medicine
